# Patient Record
Sex: Female | Born: 1980 | Race: Black or African American | Hispanic: No | Marital: Single | Smoking: Current every day smoker
Health system: Southern US, Community
[De-identification: ages and names within clinical notes are randomized; demographics above are authoritative.]

## PROBLEM LIST (undated history)

## (undated) DIAGNOSIS — M419 Scoliosis, unspecified: Secondary | ICD-10-CM

## (undated) DIAGNOSIS — A599 Trichomoniasis, unspecified: Secondary | ICD-10-CM

## (undated) DIAGNOSIS — F32A Depression, unspecified: Secondary | ICD-10-CM

## (undated) DIAGNOSIS — F329 Major depressive disorder, single episode, unspecified: Secondary | ICD-10-CM

## (undated) HISTORY — DX: Major depressive disorder, single episode, unspecified: F32.9

## (undated) HISTORY — DX: Trichomoniasis, unspecified: A59.9

## (undated) HISTORY — DX: Depression, unspecified: F32.A

## (undated) HISTORY — PX: BUNIONECTOMY: SHX129

## (undated) HISTORY — PX: TUBAL LIGATION: SHX77

---
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
## 2001-02-21 ENCOUNTER — Emergency Department (HOSPITAL_COMMUNITY): Admission: EM | Admit: 2001-02-21 | Discharge: 2001-02-21 | Payer: Self-pay | Admitting: Emergency Medicine

## 2001-04-05 ENCOUNTER — Emergency Department (HOSPITAL_COMMUNITY): Admission: EM | Admit: 2001-04-05 | Discharge: 2001-04-06 | Payer: Self-pay | Admitting: Emergency Medicine

## 2002-03-22 ENCOUNTER — Emergency Department (HOSPITAL_COMMUNITY): Admission: EM | Admit: 2002-03-22 | Discharge: 2002-03-22 | Payer: Self-pay | Admitting: Emergency Medicine

## 2002-03-22 ENCOUNTER — Encounter: Payer: Self-pay | Admitting: Emergency Medicine

## 2002-03-22 ENCOUNTER — Inpatient Hospital Stay (HOSPITAL_COMMUNITY): Admission: AD | Admit: 2002-03-22 | Discharge: 2002-03-22 | Payer: Self-pay | Admitting: Obstetrics and Gynecology

## 2002-09-11 ENCOUNTER — Emergency Department (HOSPITAL_COMMUNITY): Admission: EM | Admit: 2002-09-11 | Discharge: 2002-09-11 | Payer: Self-pay | Admitting: Emergency Medicine

## 2002-10-30 ENCOUNTER — Emergency Department (HOSPITAL_COMMUNITY): Admission: EM | Admit: 2002-10-30 | Discharge: 2002-10-30 | Payer: Self-pay | Admitting: Emergency Medicine

## 2003-02-17 ENCOUNTER — Other Ambulatory Visit: Admission: RE | Admit: 2003-02-17 | Discharge: 2003-02-17 | Payer: Self-pay | Admitting: Family Medicine

## 2004-01-09 ENCOUNTER — Inpatient Hospital Stay (HOSPITAL_COMMUNITY): Admission: AD | Admit: 2004-01-09 | Discharge: 2004-01-09 | Payer: Self-pay | Admitting: Family Medicine

## 2004-02-27 ENCOUNTER — Other Ambulatory Visit: Admission: RE | Admit: 2004-02-27 | Discharge: 2004-02-27 | Payer: Self-pay | Admitting: Obstetrics and Gynecology

## 2004-07-20 ENCOUNTER — Inpatient Hospital Stay (HOSPITAL_COMMUNITY): Admission: AD | Admit: 2004-07-20 | Discharge: 2004-07-23 | Payer: Self-pay | Admitting: Obstetrics and Gynecology

## 2004-07-22 ENCOUNTER — Encounter (INDEPENDENT_AMBULATORY_CARE_PROVIDER_SITE_OTHER): Payer: Self-pay | Admitting: Specialist

## 2004-11-30 ENCOUNTER — Ambulatory Visit: Payer: Self-pay | Admitting: Family Medicine

## 2004-12-04 ENCOUNTER — Ambulatory Visit: Payer: Self-pay | Admitting: Family Medicine

## 2005-02-26 ENCOUNTER — Ambulatory Visit: Payer: Self-pay | Admitting: Family Medicine

## 2005-06-05 ENCOUNTER — Ambulatory Visit: Payer: Self-pay | Admitting: Family Medicine

## 2005-06-06 ENCOUNTER — Ambulatory Visit: Payer: Self-pay | Admitting: Internal Medicine

## 2005-06-12 ENCOUNTER — Ambulatory Visit: Payer: Self-pay | Admitting: Internal Medicine

## 2005-07-08 ENCOUNTER — Ambulatory Visit: Payer: Self-pay | Admitting: Internal Medicine

## 2005-07-15 ENCOUNTER — Ambulatory Visit: Payer: Self-pay | Admitting: Family Medicine

## 2005-08-29 ENCOUNTER — Ambulatory Visit: Payer: Self-pay | Admitting: Family Medicine

## 2005-10-28 ENCOUNTER — Ambulatory Visit: Payer: Self-pay | Admitting: Family Medicine

## 2005-10-31 ENCOUNTER — Ambulatory Visit: Payer: Self-pay | Admitting: Family Medicine

## 2005-12-31 ENCOUNTER — Ambulatory Visit: Payer: Self-pay | Admitting: Family Medicine

## 2006-01-02 ENCOUNTER — Ambulatory Visit: Payer: Self-pay | Admitting: Family Medicine

## 2006-09-04 ENCOUNTER — Ambulatory Visit: Payer: Self-pay | Admitting: Family Medicine

## 2007-02-10 ENCOUNTER — Ambulatory Visit (HOSPITAL_COMMUNITY): Admission: RE | Admit: 2007-02-10 | Discharge: 2007-02-10 | Payer: Self-pay | Admitting: Obstetrics and Gynecology

## 2007-03-05 ENCOUNTER — Inpatient Hospital Stay (HOSPITAL_COMMUNITY): Admission: AD | Admit: 2007-03-05 | Discharge: 2007-03-05 | Payer: Self-pay | Admitting: Obstetrics and Gynecology

## 2007-04-19 ENCOUNTER — Inpatient Hospital Stay (HOSPITAL_COMMUNITY): Admission: AD | Admit: 2007-04-19 | Discharge: 2007-04-21 | Payer: Self-pay | Admitting: Obstetrics and Gynecology

## 2007-05-07 ENCOUNTER — Encounter (INDEPENDENT_AMBULATORY_CARE_PROVIDER_SITE_OTHER): Payer: Self-pay | Admitting: Family Medicine

## 2007-08-06 ENCOUNTER — Emergency Department (HOSPITAL_COMMUNITY): Admission: EM | Admit: 2007-08-06 | Discharge: 2007-08-06 | Payer: Self-pay | Admitting: Emergency Medicine

## 2007-08-15 ENCOUNTER — Emergency Department (HOSPITAL_COMMUNITY): Admission: EM | Admit: 2007-08-15 | Discharge: 2007-08-15 | Payer: Self-pay | Admitting: Family Medicine

## 2007-11-24 ENCOUNTER — Telehealth (INDEPENDENT_AMBULATORY_CARE_PROVIDER_SITE_OTHER): Payer: Self-pay | Admitting: *Deleted

## 2008-05-15 ENCOUNTER — Emergency Department (HOSPITAL_COMMUNITY): Admission: EM | Admit: 2008-05-15 | Discharge: 2008-05-16 | Payer: Self-pay | Admitting: Emergency Medicine

## 2009-01-07 ENCOUNTER — Emergency Department (HOSPITAL_COMMUNITY): Admission: EM | Admit: 2009-01-07 | Discharge: 2009-01-07 | Payer: Self-pay | Admitting: Emergency Medicine

## 2009-04-05 ENCOUNTER — Inpatient Hospital Stay (HOSPITAL_COMMUNITY): Admission: AD | Admit: 2009-04-05 | Discharge: 2009-04-05 | Payer: Self-pay | Admitting: Diagnostic Radiology

## 2009-04-26 ENCOUNTER — Inpatient Hospital Stay (HOSPITAL_COMMUNITY): Admission: AD | Admit: 2009-04-26 | Discharge: 2009-04-26 | Payer: Self-pay | Admitting: Obstetrics & Gynecology

## 2009-05-17 ENCOUNTER — Emergency Department (HOSPITAL_COMMUNITY): Admission: EM | Admit: 2009-05-17 | Discharge: 2009-05-18 | Payer: Self-pay | Admitting: Emergency Medicine

## 2009-07-09 ENCOUNTER — Ambulatory Visit: Payer: Self-pay | Admitting: Physician Assistant

## 2009-07-09 ENCOUNTER — Inpatient Hospital Stay (HOSPITAL_COMMUNITY): Admission: AD | Admit: 2009-07-09 | Discharge: 2009-07-09 | Payer: Self-pay | Admitting: Obstetrics & Gynecology

## 2009-08-14 ENCOUNTER — Ambulatory Visit (HOSPITAL_COMMUNITY): Admission: RE | Admit: 2009-08-14 | Discharge: 2009-08-14 | Payer: Self-pay | Admitting: Obstetrics

## 2009-10-04 ENCOUNTER — Inpatient Hospital Stay (HOSPITAL_COMMUNITY): Admission: AD | Admit: 2009-10-04 | Discharge: 2009-10-04 | Payer: Self-pay | Admitting: Obstetrics

## 2009-11-21 ENCOUNTER — Inpatient Hospital Stay (HOSPITAL_COMMUNITY): Admission: AD | Admit: 2009-11-21 | Discharge: 2009-11-23 | Payer: Self-pay | Admitting: Obstetrics

## 2010-08-26 ENCOUNTER — Encounter: Payer: Self-pay | Admitting: Obstetrics and Gynecology

## 2010-10-23 LAB — CBC
HCT: 26.7 % — ABNORMAL LOW (ref 36.0–46.0)
Hemoglobin: 8.9 g/dL — ABNORMAL LOW (ref 12.0–15.0)
MCHC: 33.3 g/dL (ref 30.0–36.0)
MCV: 84.1 fL (ref 78.0–100.0)
Platelets: 156 10*3/uL (ref 150–400)
RBC: 3.18 MIL/uL — ABNORMAL LOW (ref 3.87–5.11)
RDW: 15.5 % (ref 11.5–15.5)
WBC: 7.2 10*3/uL (ref 4.0–10.5)

## 2010-10-23 LAB — RPR: RPR Ser Ql: NONREACTIVE

## 2010-10-29 LAB — CBC
HCT: 27.6 % — ABNORMAL LOW (ref 36.0–46.0)
Hemoglobin: 9.2 g/dL — ABNORMAL LOW (ref 12.0–15.0)
MCHC: 33.2 g/dL (ref 30.0–36.0)
MCV: 86.4 fL (ref 78.0–100.0)
Platelets: 175 10*3/uL (ref 150–400)
RBC: 3.19 MIL/uL — ABNORMAL LOW (ref 3.87–5.11)
RDW: 13.5 % (ref 11.5–15.5)
WBC: 5.9 10*3/uL (ref 4.0–10.5)

## 2010-11-06 LAB — GC/CHLAMYDIA PROBE AMP, GENITAL
Chlamydia, DNA Probe: NEGATIVE
GC Probe Amp, Genital: POSITIVE — AB

## 2010-11-06 LAB — URINALYSIS, ROUTINE W REFLEX MICROSCOPIC
Bilirubin Urine: NEGATIVE
Glucose, UA: NEGATIVE mg/dL
Ketones, ur: NEGATIVE mg/dL
Nitrite: NEGATIVE
Protein, ur: NEGATIVE mg/dL
Specific Gravity, Urine: 1.025 (ref 1.005–1.030)
Urobilinogen, UA: 0.2 mg/dL (ref 0.0–1.0)
pH: 6 (ref 5.0–8.0)

## 2010-11-06 LAB — URINE MICROSCOPIC-ADD ON

## 2010-11-06 LAB — WET PREP, GENITAL
Trich, Wet Prep: NONE SEEN
Yeast Wet Prep HPF POC: NONE SEEN

## 2010-11-08 LAB — BASIC METABOLIC PANEL
BUN: 9 mg/dL (ref 6–23)
CO2: 23 mEq/L (ref 19–32)
Calcium: 8.3 mg/dL — ABNORMAL LOW (ref 8.4–10.5)
Chloride: 107 mEq/L (ref 96–112)
Creatinine, Ser: 0.5 mg/dL (ref 0.4–1.2)
GFR calc Af Amer: >60 mL/min (ref >60)
GFR calc non Af Amer: >60 mL/min (ref >60)
Glucose, Bld: 82 mg/dL (ref 70–99)
Potassium: 3.5 mEq/L (ref 3.5–5.1)
Sodium: 138 mEq/L (ref 135–145)

## 2010-11-08 LAB — URINALYSIS, ROUTINE W REFLEX MICROSCOPIC
Bilirubin Urine: NEGATIVE
Glucose, UA: NEGATIVE mg/dL
Hgb urine dipstick: NEGATIVE
Ketones, ur: 40 mg/dL — AB
Nitrite: NEGATIVE
Protein, ur: NEGATIVE mg/dL
Specific Gravity, Urine: 1.027 (ref 1.005–1.030)
Urobilinogen, UA: 0.2 mg/dL (ref 0.0–1.0)
pH: 7 (ref 5.0–8.0)

## 2010-11-08 LAB — URINE MICROSCOPIC-ADD ON

## 2010-11-09 LAB — URINE CULTURE

## 2010-11-09 LAB — DIFFERENTIAL
Basophils Absolute: 0 10*3/uL (ref 0.0–0.1)
Basophils Relative: 0 % (ref 0–1)
Eosinophils Absolute: 0 10*3/uL (ref 0.0–0.7)
Eosinophils Relative: 0 % (ref 0–5)
Lymphocytes Relative: 18 % (ref 12–46)
Lymphs Abs: 1.2 10*3/uL (ref 0.7–4.0)
Monocytes Absolute: 0.4 10*3/uL (ref 0.1–1.0)
Monocytes Relative: 7 % (ref 3–12)
Neutro Abs: 4.8 10*3/uL (ref 1.7–7.7)
Neutrophils Relative %: 74 % (ref 43–77)

## 2010-11-09 LAB — WET PREP, GENITAL
Trich, Wet Prep: NONE SEEN
Yeast Wet Prep HPF POC: NONE SEEN

## 2010-11-09 LAB — URINALYSIS, ROUTINE W REFLEX MICROSCOPIC
Bilirubin Urine: NEGATIVE
Bilirubin Urine: NEGATIVE
Bilirubin Urine: NEGATIVE
Glucose, UA: NEGATIVE mg/dL
Glucose, UA: NEGATIVE mg/dL
Hgb urine dipstick: NEGATIVE
Ketones, ur: NEGATIVE mg/dL
Ketones, ur: >80 mg/dL — AB
Nitrite: NEGATIVE
Protein, ur: NEGATIVE mg/dL
Protein, ur: NEGATIVE mg/dL
Specific Gravity, Urine: 1.015 (ref 1.005–1.030)
Urobilinogen, UA: 0.2 mg/dL (ref 0.0–1.0)
Urobilinogen, UA: 0.2 mg/dL (ref 0.0–1.0)
pH: 8.5 — ABNORMAL HIGH (ref 5.0–8.0)
pH: 7 (ref 5.0–8.0)

## 2010-11-09 LAB — COMPREHENSIVE METABOLIC PANEL
ALT: 11 U/L (ref 0–35)
ALT: 9 U/L (ref 0–35)
AST: 18 U/L (ref 0–37)
AST: 14 U/L (ref 0–37)
Albumin: 3.5 g/dL (ref 3.5–5.2)
Alkaline Phosphatase: 54 U/L (ref 39–117)
BUN: 6 mg/dL (ref 6–23)
CO2: 24 mEq/L (ref 19–32)
CO2: 21 mEq/L (ref 19–32)
Calcium: 9.3 mg/dL (ref 8.4–10.5)
Calcium: 9 mg/dL (ref 8.4–10.5)
Chloride: 105 mEq/L (ref 96–112)
Chloride: 106 mEq/L (ref 96–112)
Creatinine, Ser: 0.57 mg/dL (ref 0.4–1.2)
GFR calc Af Amer: >60 mL/min (ref >60)
GFR calc Af Amer: >60 mL/min (ref >60)
GFR calc non Af Amer: >60 mL/min (ref >60)
GFR calc non Af Amer: >60 mL/min (ref >60)
Glucose, Bld: 97 mg/dL (ref 70–99)
Potassium: 3.8 mEq/L (ref 3.5–5.1)
Sodium: 134 mEq/L — ABNORMAL LOW (ref 135–145)
Sodium: 135 mEq/L (ref 135–145)
Total Bilirubin: 0.4 mg/dL (ref 0.3–1.2)
Total Protein: 6.4 g/dL (ref 6.0–8.3)

## 2010-11-09 LAB — URINE MICROSCOPIC-ADD ON

## 2010-11-09 LAB — CBC
HCT: 35.5 % — ABNORMAL LOW (ref 36.0–46.0)
Hemoglobin: 11.8 g/dL — ABNORMAL LOW (ref 12.0–15.0)
MCHC: 33.3 g/dL (ref 30.0–36.0)
MCHC: 33.4 g/dL (ref 30.0–36.0)
MCV: 90.2 fL (ref 78.0–100.0)
Platelets: 205 10*3/uL (ref 150–400)
RBC: 3.93 MIL/uL (ref 3.87–5.11)
RBC: 3.77 MIL/uL — ABNORMAL LOW (ref 3.87–5.11)
RDW: 13.1 % (ref 11.5–15.5)
WBC: 6.4 10*3/uL (ref 4.0–10.5)
WBC: 6.9 10*3/uL (ref 4.0–10.5)

## 2010-11-09 LAB — GC/CHLAMYDIA PROBE AMP, GENITAL: Chlamydia, DNA Probe: NEGATIVE

## 2010-12-17 ENCOUNTER — Other Ambulatory Visit: Payer: Self-pay | Admitting: Obstetrics

## 2010-12-17 ENCOUNTER — Inpatient Hospital Stay (HOSPITAL_COMMUNITY)
Admission: AD | Admit: 2010-12-17 | Discharge: 2010-12-19 | DRG: 767 | Disposition: A | Discharge status: Home / Self Care | Payer: Medicaid Other | Source: Ambulatory Visit | Attending: Obstetrics | Admitting: Obstetrics

## 2010-12-17 DIAGNOSIS — O99892 Other specified diseases and conditions complicating childbirth: Secondary | ICD-10-CM | POA: Diagnosis present

## 2010-12-17 DIAGNOSIS — Z22330 Carrier of Group B streptococcus: Secondary | ICD-10-CM

## 2010-12-17 DIAGNOSIS — O4290 Premature rupture of membranes, unspecified as to length of time between rupture and onset of labor, unspecified weeks of gestation: Secondary | ICD-10-CM | POA: Diagnosis present

## 2010-12-17 DIAGNOSIS — Z302 Encounter for sterilization: Secondary | ICD-10-CM

## 2010-12-17 LAB — CBC
Hemoglobin: 11.6 g/dL — ABNORMAL LOW (ref 12.0–15.0)
MCHC: 32.7 g/dL (ref 30.0–36.0)
Platelets: 156 10*3/uL (ref 150–400)
RDW: 14.1 % (ref 11.5–15.5)

## 2010-12-17 LAB — SURGICAL PCR SCREEN: Staphylococcus aureus: NEGATIVE

## 2010-12-17 LAB — RPR: RPR Ser Ql: NONREACTIVE

## 2010-12-18 LAB — CBC
MCH: 26.7 pg (ref 26.0–34.0)
MCHC: 31.5 g/dL (ref 30.0–36.0)
Platelets: 150 10*3/uL (ref 150–400)
RDW: 14.3 % (ref 11.5–15.5)

## 2010-12-19 NOTE — Op Note (Signed)
  NAMESYDELLE, SHERFIELD               ACCOUNT NO.:  [id number]  MEDICAL RECORD NO.:  [id number]           PATIENT TYPE:  I  LOCATION:  9104                          FACILITY:  [facility]  PHYSICIAN:  Kathreen Cosier, M.D.DATE OF BIRTH:  [birth year]  DATE OF PROCEDURE:  12/18/2010 DATE OF DISCHARGE:                              OPERATIVE REPORT   PREOPERATIVE DIAGNOSIS:  Multiparity, desires sterilization postpartum.  POSTOPERATIVE DIAGNOSIS:  Multiparity, desires sterilization postpartum.  SURGEON:  Kathreen Cosier, MD  ANESTHESIA:  Spinal.  PROCEDURE IN DETAIL:  The patient was placed on the operating table in supine position after the spinal administered.  Abdomen prepped and draped.  Bladder emptied with Foley catheter.  Midline subumbilical incision 1-inch long made and carried down to the fascia.  Fascia cleaned, grasped with two Kochers and the fascia and the peritoneum opened with the Mayo scissors.  Left tube grasped in midportion with a Babcock clamp.  Zero plain suture placed in the mesosalpinx below the portion of tube within the clamp.  This was tied and approximately 1 inch of tube was transected.  Hemostasis was satisfactory.  The procedure was done in a similar fashion on the other side.  Lap and sponge counts correct.  Abdomen closed in layers.  Peritoneum and fascia with continuous suture of Dexon and the skin closed with subcuticular stitch of 4-0 Monocryl.  Blood loss was minimal.  The patient tolerated the procedure well and taken to the recovery room in good condition.          ______________________________ Kathreen Cosier, M.D.     BAM/MEDQ  D:  12/18/2010  T:  12/18/2010  Job:  [id number]  Electronically Signed by Francoise Ceo M.D. on 12/19/2010 09:46:23 AM

## 2010-12-19 NOTE — H&P (Signed)
  NAMEALYSSAH, ALGEO               ACCOUNT NO.:  [id number]  MEDICAL RECORD NO.:  [id number]           PATIENT TYPE:  I  LOCATION:  9104                          [facility]:  [facility]  PHYSICIAN:  Kathreen Cosier, M.D.DATE OF BIRTH:  Aug 27, 1980  DATE OF ADMISSION:  12/17/2010 DATE OF DISCHARGE:                             HISTORY & PHYSICAL   CHIEF COMPLAINT:  Desires sterilization.  HISTORY OF PRESENT ILLNESS:  The patient is a 30 year old gravida 6, para 4-1-0-5, [facility] Dec 23, 2010 who was admitted in labor on Dec 17, 2010 and had a normal vaginal delivery and desired sterilization.  PAST MEDICAL HISTORY:  She has had 5 full-term babies and 1 premature baby.  FAMILY HISTORY:  Noncontributory.  SOCIAL HISTORY:  She denied smoking, drinking, or any kind of drug use.  ALLERGIES:  None.  PHYSICAL EXAMINATION:  GENERAL:  A well-developed female in no distress. HEENT:  Negative. BREASTS:  No masses. HEART:  Regular rhythm.  No murmurs, no gallops, and no rubs. LUNGS:  Clear to percussion and auscultation. ABDOMEN:  20-week postpartum size uterus. PELVIC:  Deferred. EXTREMITIES:  Negative.          ______________________________ Kathreen Cosier, M.D.     BAM/MEDQ  D:  12/18/2010  T:  12/18/2010  Job:  [id number]  Electronically Signed by Francoise Ceo M.D. on 12/19/2010 09:46:20 AM

## 2010-12-21 NOTE — H&P (Signed)
NAMEALLIEN, MELBERG               ACCOUNT NO.:  [id number]   MEDICAL RECORD NO.:  [id number]           PATIENT TYPE:   LOCATION:                                 FACILITY:   PHYSICIAN:  Hal Morales, M.D.     DATE OF BIRTH:   DATE OF ADMISSION:  07/20/2004  DATE OF DISCHARGE:                                HISTORY & PHYSICAL   Tammy May is a 30 year old gravida 4, para 2-0-1-2 at 36-1/7 weeks who  presented complaining of onset of bright red bleeding approximately 8:15  p.m.  She denies any significant cramping, drug use, trauma, recent  intercourse, or pain.  She has no dysuria.  She reports positive fetal  movement.  While in maternity admissions unit, the patient was monitored.  She was found to be having some contractions, although the cervix was only 1  cm.  The baby was reactive.  Ultrasound was normal.  Labs were normal;  however, in light of the continued small to moderate amount of bright red  bleeding, the patient was admitted overnight for continued observation.   Pregnancy has been remarkable for:  1.  History of rapid labor.  2.  History of chlamydia in the past.  3.  History of chlamydia in June of 2005 with negative test of cure.  4.  Diminished decreased BMI.  5.  History of postpartum depression.  6.  Family history of polydactyly.  7.  Smoker before pregnancy but none since positive UPT.   PRENATAL LABORATORY DATA:  Blood type is O positive, Rh antibody negative.  VDRL nonreactive.  Rubella titer positive, hepatitis B surface antigen  negative.  HIV nonreactive.  GC and chlamydia cultures were done in June.  Gonorrhea was negative.  Chlamydia was positive.  Follow-up test of cure was  negative.  Group B strep culture was negative at 36 weeks.  GC and chlamydia  cultures were negative at 36 weeks.  Cystic fibrosis testing was negative.  Hemoglobin upon entering the practice was 11.4.  It was 11.4 at 28 weeks.  EDC of August 16, 2004 was established by  last menstrual period and was in  agreement with ultrasound at approximately 18 weeks.   HISTORY OF PRESENT PREGNANCY:  The patient entered care at approximately 10  weeks.  She had some first trimester bleeding and had an ultrasound.  She  was having some increased stress and decreased appetite at 15 weeks.  Referral was offered for counseling.  The patient declined at present.  Quadruple screen was normal.  Test of cure from her first visit with  positive chlamydia was negative.  She had an ultrasound at 18 weeks that  showed normal growth and development.  Placenta was posterior.  Fluid was  normal.  She had another ultrasound at 28 weeks for low weight gain and size  less than dates.  Growth was at the 25-50th percentile.  Placenta was  posterior.  Cervix was normal.  Glucola was normal.  She had another  ultrasound at 35 weeks that showed growth at the 25th to 50th percentile.  Cervix was  3.2.  Fluid was normal.  The rest of her pregnancy has been  uncomplicated.   OBSTETRICAL HISTORY:  In 1998, she had spontaneous miscarriage with no  complications.  In 1999, she had a vaginal birth of a female infant, weight 5  pounds, 14 ounces at 41 weeks.  She was in labor 16 hours.  She had epidural  anesthesia.  She was induced, had no complications.  In 2001, she had a  vaginal birth of a female infant, weight 7 pounds at 40 weeks.  She was in  labor approximately 1 hour.  She was followed at high-risk clinic but was  not sure why.  This pregnancy is with a new partner.  She had diminished  iron with both pregnancies.  She had postpartum depression with both  pregnancies, worse after her third pregnancy.  It resolved spontaneously  after 2 weeks.   MEDICAL HISTORY:  She is a previous condom user.  She was treated for  chlamydia in 2002 and then in June, 2005.  She reports usual childhood  illnesses.   ALLERGIES:  None.   FAMILY HISTORY:  The patient's maternal grandfather had rheumatoid   arthritis.  Mother is an alcohol user.   GENETIC HISTORY:  Remarkable for the patient's son having extra digits on  both hands.  His father also had extra digits.  The patient is not sure  about this father of the baby's family history.   SOCIAL HISTORY:  The patient is single.  Father of the baby is involved in  support.  His name is Roman Designer, multimedia.  The patient has a 10th grade  education.  She is a home health aide.  Her partner has an 11th grade  education.  He is a Consulting civil engineer and a Medical sales representative.  She has been followed  by the certified nurse midwife service at [facility].  She denies  any alcohol, drug, or tobacco use during this pregnancy.   PHYSICAL EXAMINATION:  VITAL SIGNS:  Stable.  The patient is afebrile.  Orthostatic blood pressures and pulses are stable.  HEENT:  Within normal limits.  LUNGS:  Breath sounds are clear.  HEART:  Regular rate and rhythm without murmur.  BREASTS:  Soft and nontender.  FETAL MONITORING:  Shows reactive fetal heart rate tracing.  On the initial  tracing there was some questionable missed beats that were audible at times  but no decelerations were noted.  Uterine contractions: There were none  initially.  Then after prolonged observation, she was contracting  approximately every 6 minutes but very mild.  ABDOMEN:  Soft and nontender and gravid.  PELVIC:  Speculum exam showed initially a moderate amount of dark blood in  the vault.  Once this was removed, via Fox swab, there was an overall small  amount of active bleeding.  This was more obvious when the patient got up to  move around.  The cervix was posterior, 1 cm, 50%, vertex at a -2 station.  Vertex was verified by bedside ultrasound.  EXTREMITIES:  Deep tendon reflexes are 2+ without clonus.  There is some  trace edema noted.   Ultrasound showed anterior placenta, no previa, normal fluid.  BPP of 8/8.  CBC showed hemoglobin of 11.0, platelets were 183.   ASSESSMENT:  1.   Intrauterine pregnancy at 36-1/7 weeks.  2.  Third trimester bleeding with questionable etiology.  3.  Evidence of fetal well being.   PLAN:  1.  Admit to [facility] for consult with Dr. Dierdre Forth as attending physician for overnight observation.  2.  Dr. Pennie Rushing discussed possible need for delivery with patient but will      defer for now in light of reassuring fetal heart rate status.  3.  We will continuously monitor and follow up in the morning with      reevaluation.     Vick   VLL/MEDQ  D:  07/20/2004  T:  07/20/2004  Job:  [id number]

## 2011-05-06 LAB — URINALYSIS, ROUTINE W REFLEX MICROSCOPIC
Ketones, ur: >80 — AB
Nitrite: NEGATIVE
Protein, ur: NEGATIVE

## 2011-05-06 LAB — POCT I-STAT, CHEM 8
Calcium, Ion: 1.14
Creatinine, Ser: 0.8
Hemoglobin: 14.6
Sodium: 141
TCO2: 19

## 2011-05-06 LAB — URINE CULTURE: Colony Count: 15000

## 2011-05-06 LAB — POCT PREGNANCY, URINE: Preg Test, Ur: NEGATIVE

## 2011-05-16 LAB — CBC
MCHC: 33.8
Platelets: 198
RBC: 3.76 — ABNORMAL LOW
WBC: 12.2 — ABNORMAL HIGH

## 2011-05-17 LAB — CBC
MCV: 86.4
Platelets: 201
RBC: 3.93
WBC: 6.6

## 2011-05-17 LAB — RPR: RPR Ser Ql: NONREACTIVE

## 2011-05-20 LAB — CBC
Hemoglobin: 10 — ABNORMAL LOW
MCHC: 33.5
RBC: 3.42 — ABNORMAL LOW

## 2011-05-20 LAB — COMPREHENSIVE METABOLIC PANEL
ALT: 12
Alkaline Phosphatase: 134 — ABNORMAL HIGH
CO2: 19
GFR calc non Af Amer: >60
Glucose, Bld: 172 — ABNORMAL HIGH
Potassium: 3.2 — ABNORMAL LOW
Sodium: 132 — ABNORMAL LOW
Total Bilirubin: 0.8

## 2011-05-20 LAB — T3: T3, Total: 210.5 — ABNORMAL HIGH

## 2012-07-28 ENCOUNTER — Emergency Department (INDEPENDENT_AMBULATORY_CARE_PROVIDER_SITE_OTHER)
Admission: EM | Admit: 2012-07-28 | Discharge: 2012-07-28 | Disposition: A | Discharge status: Home / Self Care | Payer: Medicaid Other | Source: Home / Self Care

## 2012-07-28 ENCOUNTER — Encounter (HOSPITAL_COMMUNITY): Payer: Self-pay | Admitting: Emergency Medicine

## 2012-07-28 DIAGNOSIS — A088 Other specified intestinal infections: Secondary | ICD-10-CM

## 2012-07-28 DIAGNOSIS — A084 Viral intestinal infection, unspecified: Secondary | ICD-10-CM

## 2012-07-28 MED ORDER — ONDANSETRON 4 MG PO TBDP
ORAL_TABLET | ORAL | Status: AC
  Filled 2012-07-28: qty 2

## 2012-07-28 MED ORDER — ONDANSETRON HCL 4 MG PO TABS: 4.0000 mg | ORAL_TABLET | Freq: Four times a day (QID) | ORAL | Status: DC

## 2012-07-28 MED ORDER — ONDANSETRON 4 MG PO TBDP
8.0000 mg | ORAL_TABLET | Freq: Once | ORAL | Status: AC
  Administered 2012-07-28: 8 mg via ORAL

## 2012-07-28 NOTE — ED Provider Notes (Signed)
History     CSN: [id number]  Arrival date & time 07/28/12  1109   None     Chief Complaint  Patient presents with  . Emesis    (Consider location/radiation/quality/duration/timing/severity/associated sxs/prior treatment) Patient is a 31 y.o. female presenting with vomiting. The history is provided by the patient.  Emesis  This is a new problem. The current episode started 2 days ago. The problem occurs 2 to 4 times per day. The problem has not changed since onset.The emesis has an appearance of stomach contents. There has been no fever. Associated symptoms include abdominal pain, chills, diarrhea and a fever. Pertinent negatives include no cough and no URI.    History reviewed. No pertinent past medical history.  No past surgical history on file.  No family history on file.  History  Substance Use Topics  . Smoking status: Not on file  . Smokeless tobacco: Not on file  . Alcohol Use: Not on file    OB History    Grav Para Term Preterm Abortions TAB SAB Ect Mult Living                  Review of Systems  Constitutional: Positive for fever and chills.  Respiratory: Negative for cough.   Gastrointestinal: Positive for vomiting, abdominal pain and diarrhea.    Allergies  Review of patient's allergies indicates no known allergies.  Home Medications   Current Outpatient Rx  Name  Route  Sig  Dispense  Refill  . ONDANSETRON HCL 4 MG PO TABS   Oral   Take 1 tablet (4 mg total) by mouth every 6 (six) hours. For n/v   6 tablet   0     BP 92/58  Pulse 70  Temp 98.3 F (36.8 C) (Oral)  Resp 18  SpO2 98%  Physical Exam  Nursing note and vitals reviewed. Constitutional: She is oriented to person, place, and time. She appears well-developed and well-nourished.  HENT:  Mouth/Throat: Oropharynx is clear and moist and mucous membranes are normal.  Neck: Normal range of motion. Neck supple.  Pulmonary/Chest: Effort normal and breath sounds normal.  Abdominal:  Soft. Bowel sounds are normal. She exhibits no distension and no mass. There is tenderness. There is no rebound and no guarding.  Neurological: She is alert and oriented to person, place, and time.  Skin: Skin is warm and dry.    ED Course  Procedures (including critical care time)  Labs Reviewed - No data to display No results found.   1. Gastroenteritis and colitis, viral       MDM          Linna Hoff, MD 07/28/12 [phone]

## 2012-07-28 NOTE — ED Notes (Signed)
Reports vomiting for two days. C/o chills cramping, abd pain and diarrhea.  Patient has used OTC medications but no relief.

## 2012-08-27 ENCOUNTER — Emergency Department (HOSPITAL_COMMUNITY)
Admission: EM | Admit: 2012-08-27 | Discharge: 2012-08-27 | Disposition: A | Discharge status: Home / Self Care | Payer: Self-pay | Attending: Emergency Medicine | Admitting: Emergency Medicine

## 2012-08-27 ENCOUNTER — Encounter (HOSPITAL_COMMUNITY): Payer: Self-pay | Admitting: *Deleted

## 2012-08-27 ENCOUNTER — Emergency Department (HOSPITAL_COMMUNITY): Payer: Self-pay

## 2012-08-27 DIAGNOSIS — S2341XA Sprain of ribs, initial encounter: Secondary | ICD-10-CM | POA: Insufficient documentation

## 2012-08-27 DIAGNOSIS — R079 Chest pain, unspecified: Secondary | ICD-10-CM

## 2012-08-27 DIAGNOSIS — Z79899 Other long term (current) drug therapy: Secondary | ICD-10-CM | POA: Insufficient documentation

## 2012-08-27 DIAGNOSIS — R0789 Other chest pain: Secondary | ICD-10-CM | POA: Insufficient documentation

## 2012-08-27 DIAGNOSIS — F17200 Nicotine dependence, unspecified, uncomplicated: Secondary | ICD-10-CM | POA: Insufficient documentation

## 2012-08-27 DIAGNOSIS — R0602 Shortness of breath: Secondary | ICD-10-CM | POA: Insufficient documentation

## 2012-08-27 DIAGNOSIS — S29011A Strain of muscle and tendon of front wall of thorax, initial encounter: Secondary | ICD-10-CM

## 2012-08-27 DIAGNOSIS — X58XXXA Exposure to other specified factors, initial encounter: Secondary | ICD-10-CM | POA: Insufficient documentation

## 2012-08-27 DIAGNOSIS — Y929 Unspecified place or not applicable: Secondary | ICD-10-CM | POA: Insufficient documentation

## 2012-08-27 DIAGNOSIS — Y939 Activity, unspecified: Secondary | ICD-10-CM | POA: Insufficient documentation

## 2012-08-27 LAB — CBC WITH DIFFERENTIAL/PLATELET
Basophils Absolute: 0 10*3/uL (ref 0.0–0.1)
Eosinophils Absolute: 0.1 10*3/uL (ref 0.0–0.7)
Eosinophils Relative: 1 % (ref 0–5)
Lymphs Abs: 2.6 10*3/uL (ref 0.7–4.0)
MCH: 28.8 pg (ref 26.0–34.0)
MCV: 87.1 fL (ref 78.0–100.0)
Platelets: 254 10*3/uL (ref 150–400)
RDW: 13.1 % (ref 11.5–15.5)

## 2012-08-27 LAB — COMPREHENSIVE METABOLIC PANEL
ALT: 12 U/L (ref 0–35)
Calcium: 8.8 mg/dL (ref 8.4–10.5)
GFR calc Af Amer: >90 mL/min (ref >90)
Glucose, Bld: 87 mg/dL (ref 70–99)
Sodium: 139 mEq/L (ref 135–145)
Total Protein: 7.2 g/dL (ref 6.0–8.3)

## 2012-08-27 MED ORDER — HYDROCODONE-ACETAMINOPHEN 5-325 MG PO TABS
2.0000 | ORAL_TABLET | Freq: Once | ORAL | Status: AC
  Administered 2012-08-27: 2 via ORAL
  Filled 2012-08-27: qty 2

## 2012-08-27 MED ORDER — IBUPROFEN 600 MG PO TABS: 600.0000 mg | ORAL_TABLET | Freq: Four times a day (QID) | ORAL | Status: DC | PRN

## 2012-08-27 MED ORDER — HYDROCODONE-ACETAMINOPHEN 5-325 MG PO TABS: 1.0000 | ORAL_TABLET | Freq: Four times a day (QID) | ORAL | Status: DC | PRN

## 2012-08-27 MED ORDER — CYCLOBENZAPRINE HCL 10 MG PO TABS: 10.0000 mg | ORAL_TABLET | Freq: Two times a day (BID) | ORAL | Status: DC | PRN

## 2012-08-27 NOTE — ED Provider Notes (Signed)
History   This chart was scribed for non-physician practitioner working with Richardean Canal, MD by Bennett Scrape, ED Scribe. This patient was seen in room TR04C/TR04C and the patient's care was started at 21:05.  CSN: [id number]  Arrival date & time 08/27/12  1831   First MD Initiated Contact with Patient 08/27/12 2037      Chief Complaint  Patient presents with  . Cough  . Shortness of Breath    Patient is a 32 y.o. female presenting with cough and shortness of breath.  Cough Associated symptoms include chest pain. Pertinent negatives include no shortness of breath and no wheezing.  Shortness of Breath  Associated symptoms include chest pain and cough. Pertinent negatives include no fever, no shortness of breath and no wheezing.    Tammy May is a 32 y.o. female who presents to the Emergency Department complaining of 3 days of new, constant, unchanged, moderate chest pain with productive cough. Pain is aggravated with cough, pressure, and certain positions, while alleviated by nothing. No injury mechanism denoted. There has been no improvement despite taking Percocet and Muscle Relaxer. Pt also c/o 3 weeks of constant cough with green sputum. Pt is a current everyday smoker, admitting occassional alcohol use. No pertinent medical or surgical Hx denoted.   History reviewed. No pertinent past medical history.  Past Surgical History  Procedure Date  . Tubal ligation     No family history on file.  History  Substance Use Topics  . Smoking status: Current Every Day Smoker -- 0.5 packs/day    Types: Cigarettes  . Smokeless tobacco: Not on file  . Alcohol Use: Yes     Comment: occasionally    Review of Systems  Constitutional: Negative for fever.  Respiratory: Positive for cough and chest tightness. Negative for shortness of breath and wheezing.   Cardiovascular: Positive for chest pain.  Gastrointestinal: Negative for nausea, vomiting and diarrhea.  All other systems  reviewed and are negative.    Allergies  Review of patient's allergies indicates no known allergies.  Home Medications   Current Outpatient Rx  Name  Route  Sig  Dispense  Refill  . FLUOXETINE HCL 20 MG PO CAPS   Oral   Take 20 mg by mouth daily.         Marland Kitchen MIRTAZAPINE 15 MG PO TABS   Oral   Take 15 mg by mouth at bedtime.           BP 105/55  Pulse 87  Temp 98.4 F (36.9 C) (Oral)  Resp 18  SpO2 100%  LMP 08/05/2012  Physical Exam  Nursing note and vitals reviewed. Constitutional: She is oriented to person, place, and time. She appears well-developed and well-nourished. No distress.  HENT:  Head: Normocephalic and atraumatic.  Eyes: Conjunctivae normal and EOM are normal.  Neck: Neck supple. No tracheal deviation present.  Cardiovascular: Normal rate.   Pulmonary/Chest: Effort normal. No respiratory distress.       Tenderness to the intercostal muscles.  Abdominal: She exhibits no distension.  Musculoskeletal: Normal range of motion.  Neurological: She is alert and oriented to person, place, and time. No sensory deficit.  Skin: Skin is dry.  Psychiatric: She has a normal mood and affect. Her behavior is normal.    ED Course  Procedures DIAGNOSTIC STUDIES: Oxygen Saturation is 100% on room air, normal by my interpretation.    COORDINATION OF CARE: 19:13- Ordered DG Chest 2 View 1 time imaging. 19:14- Ordered CBC  with Differentials and Comprehensive metabolic panel STAT 21:05- Evaluated Pt. Pt is awake, alert, and without distress. 21:10- Patient understand and agree with initial ED impression and plan with expectations set for ED visit. Discussed use of cold compress for treatmeent.    Labs Reviewed  CBC WITH DIFFERENTIAL  COMPREHENSIVE METABOLIC PANEL   Dg Chest 2 View  08/27/2012  *RADIOLOGY REPORT*  Clinical Data: Chest pain and shortness of breath.  CHEST - 2 VIEW  Comparison: PA and lateral chest 03/05/2007.  Findings: Lungs are clear.  No  pneumothorax or pleural effusion. Heart size normal.  IMPRESSION: Negative chest.   Original Report Authenticated By: Holley Dexter, M.D.      1. Chest pain   2. Intercostal muscle strain       MDM  32 year old female with cough and chest pain. No evidence of pneumonia, doubt cardiac etiology, as the pain is reproducible with movement and with coughing. Suspect that this likely intercostal muscle strain. Will treat with pain medicine and muscle relaxer and ice. Patient understands and agrees with the plan. She is stable and ready for discharge.      I personally performed the services described in this documentation, which was scribed in my presence. The recorded information has been reviewed and is accurate.     Roxy Horseman, PA-C 08/28/12 [phone]

## 2012-08-27 NOTE — ED Notes (Signed)
Pt c/o cough that is productive with yellow/green sputum.  States whenever she coughs, sneezes or cries her chest hurts.  Afebrile with no audible wheezing.  Pt with sats of 100% on RA.

## 2012-08-29 NOTE — ED Provider Notes (Signed)
Medical screening examination/treatment/procedure(s) were performed by non-physician practitioner and as supervising physician I was immediately available for consultation/collaboration.   David H Yao, MD 08/29/12 0902 

## 2013-12-21 ENCOUNTER — Emergency Department (HOSPITAL_COMMUNITY)
Admission: EM | Admit: 2013-12-21 | Discharge: 2013-12-21 | Disposition: A | Discharge status: Home / Self Care | Payer: Medicaid Other | Attending: Emergency Medicine | Admitting: Emergency Medicine

## 2013-12-21 ENCOUNTER — Emergency Department (HOSPITAL_COMMUNITY): Payer: Medicaid Other

## 2013-12-21 ENCOUNTER — Encounter (HOSPITAL_COMMUNITY): Payer: Self-pay | Admitting: Emergency Medicine

## 2013-12-21 DIAGNOSIS — R1110 Vomiting, unspecified: Secondary | ICD-10-CM | POA: Insufficient documentation

## 2013-12-21 DIAGNOSIS — F17200 Nicotine dependence, unspecified, uncomplicated: Secondary | ICD-10-CM | POA: Insufficient documentation

## 2013-12-21 DIAGNOSIS — J189 Pneumonia, unspecified organism: Secondary | ICD-10-CM

## 2013-12-21 DIAGNOSIS — Z79899 Other long term (current) drug therapy: Secondary | ICD-10-CM | POA: Insufficient documentation

## 2013-12-21 DIAGNOSIS — J159 Unspecified bacterial pneumonia: Secondary | ICD-10-CM | POA: Insufficient documentation

## 2013-12-21 HISTORY — DX: Scoliosis, unspecified: M41.9

## 2013-12-21 MED ORDER — HYDROCODONE-HOMATROPINE 5-1.5 MG/5ML PO SYRP: 5.0000 mL | ORAL_SOLUTION | Freq: Four times a day (QID) | ORAL | Status: DC | PRN

## 2013-12-21 MED ORDER — LEVOFLOXACIN 500 MG PO TABS: 500.0000 mg | ORAL_TABLET | Freq: Every day | ORAL | Status: DC

## 2013-12-21 MED ORDER — ALBUTEROL SULFATE HFA 108 (90 BASE) MCG/ACT IN AERS
2.0000 | INHALATION_SPRAY | Freq: Once | RESPIRATORY_TRACT | Status: AC
  Administered 2013-12-21: 2 via RESPIRATORY_TRACT
  Filled 2013-12-21: qty 6.7

## 2013-12-21 NOTE — ED Provider Notes (Signed)
CSN: [id number]     Arrival date & time 12/21/13  1004 History  This chart was scribed for non-physician practitioner, [name], PA-C working with Raeford RazorStephen Kohut, MD by Greggory StallionKayla Andersen, ED scribe. This patient was seen in room TR06C/TR06C and the patient's care was started at 11:18 AM.   Chief Complaint  Patient presents with  . Cough   The history is provided by the patient. No language interpreter was used.   HPI Comments: Tammy May is a 33 y.o. female who presents to the Emergency Department complaining of cough that started 3-4 days ago. The cough is hacking. She has had associated post tussive emesis and intermittent mild headache. Pt states she has also been having decreased appetite. Denies fever. Denies sick contacts. Did not try anything at home for symptoms.   Past Medical History  Diagnosis Date  . Scoliosis    Past Surgical History  Procedure Laterality Date  . Tubal ligation     History reviewed. No pertinent family history. History  Substance Use Topics  . Smoking status: Current Every Day Smoker -- 0.50 packs/day    Types: Cigarettes  . Smokeless tobacco: Not on file  . Alcohol Use: Yes     Comment: occasionally   OB History   Grav Para Term Preterm Abortions TAB SAB Ect Mult Living                 Review of Systems  Constitutional: Positive for appetite change. Negative for fever.  Respiratory: Positive for cough.   Gastrointestinal: Positive for vomiting.  Neurological: Positive for headaches.  All other systems reviewed and are negative.  Allergies  Review of patient's allergies indicates no known allergies.  Home Medications   Prior to Admission medications   Medication Sig Start Date End Date Taking? Authorizing Provider  cyclobenzaprine (FLEXERIL) 10 MG tablet Take 1 tablet (10 mg total) by mouth 2 (two) times daily as needed for muscle spasms. 08/27/12   Roxy Horsemanobert Browning, PA-C  FLUoxetine (PROZAC) 20 MG capsule Take 20 mg by mouth  daily.    Historical Provider, MD  HYDROcodone-acetaminophen (NORCO/VICODIN) 5-325 MG per tablet Take 1 tablet by mouth every 6 (six) hours as needed for pain. 08/27/12   Roxy Horsemanobert Browning, PA-C  ibuprofen (ADVIL,MOTRIN) 600 MG tablet Take 1 tablet (600 mg total) by mouth every 6 (six) hours as needed for pain. 08/27/12   Roxy Horsemanobert Browning, PA-C  mirtazapine (REMERON) 15 MG tablet Take 15 mg by mouth at bedtime.    Historical Provider, MD   BP 96/61  Pulse 77  Temp(Src) 98.1 F (36.7 C) (Oral)  Resp 18  Ht 5\' 3"  (1.6 m)  Wt 130 lb (58.968 kg)  BMI 23.03 kg/m2  SpO2 100%  LMP 12/03/2013  Physical Exam  Nursing note and vitals reviewed. Constitutional: She is oriented to person, place, and time. She appears well-developed and well-nourished. No distress.  HENT:  Head: Normocephalic and atraumatic.  Eyes: EOM are normal.  Neck: Neck supple. No tracheal deviation present.  Cardiovascular: Normal rate, regular rhythm and normal heart sounds.   Pulmonary/Chest: Effort normal and breath sounds normal. No respiratory distress. She has no wheezes. She has no rhonchi. She has no rales.  Pt coughing throughout exam.  Musculoskeletal: Normal range of motion.  Neurological: She is alert and oriented to person, place, and time.  Skin: Skin is warm and dry.  Psychiatric: She has a normal mood and affect. Her behavior is normal.    ED Course  Procedures (including critical care time)  DIAGNOSTIC STUDIES: Oxygen Saturation is 100% on RA, normal by my interpretation.    COORDINATION OF CARE: 11:21 AM-Discussed treatment plan which includes an antibiotic with pt at bedside and pt agreed to plan.   Labs Review Labs Reviewed - No data to display  Imaging Review Dg Chest 2 View  12/21/2013   CLINICAL DATA:  33 year old female with cough. Initial encounter.  EXAM: CHEST  2 VIEW  COMPARISON:  08/27/2012 and earlier.  FINDINGS: Stable common normal lung volumes. Normal cardiac size and mediastinal  contours. Visualized tracheal air column is within normal limits. No pneumothorax, pulmonary edema, pleural effusion. Subtle increased lower lobe opacity on both views. This appears to affect both lower lobes. No overt consolidation. Mild scoliosis. No acute osseous abnormality identified.  IMPRESSION: Patchy bilateral lower lobe opacity suggestive of bronchopneumonia in this setting.   Electronically Signed   By: Augusto GambleLee  Hall M.D.   On: 12/21/2013 11:13     EKG Interpretation None      MDM   Final diagnoses:  CAP (community acquired pneumonia)    11:24 AM Patient has bilateral lower lobe opacity suggesting pneumonia. Patient's vitals stable and patient afebrile. Patient will be discharged with Levaquin and hycodan. Patient will have albuterol inhaler here. Patient instructed to return with worsening or concerning symptoms.   I personally performed the services described in this documentation, which was scribed in my presence. The recorded information has been reviewed and is accurate.  Emilia BeckKaitlyn Bulah Lurie, PA-C 12/21/13 1125

## 2013-12-21 NOTE — Discharge Instructions (Signed)
Take levaquin as directed until gone. Take hycodan as needed for cough. Refer to attached documents for more information. Return to the ED with worsening or concerning symptoms.

## 2013-12-21 NOTE — ED Notes (Signed)
She states for the past 3 days shes been coughing, aching all over her body, and had no appetite. She said she coughed so hard one time she vomited

## 2013-12-21 NOTE — Discharge Planning (Signed)
[facility] Community Liaison  Spoke to patient about establishing care with a provider. Patient sts she does have medicaid but unaware of the medical home listed on the card. Patient was given a list of providers in the area who accept medicaid. My contact information was also provided for any future questions or concerns.

## 2013-12-25 NOTE — ED Provider Notes (Signed)
Medical screening examination/treatment/procedure(s) were performed by non-physician practitioner and as supervising physician I was immediately available for consultation/collaboration.   EKG Interpretation None       Contrina Orona, MD 12/25/13 1339 

## 2015-04-05 ENCOUNTER — Emergency Department (HOSPITAL_COMMUNITY): Payer: Medicaid Other

## 2015-04-05 ENCOUNTER — Emergency Department (HOSPITAL_COMMUNITY)
Admission: EM | Admit: 2015-04-05 | Discharge: 2015-04-05 | Disposition: A | Discharge status: Home / Self Care | Payer: Medicaid Other | Attending: Emergency Medicine | Admitting: Emergency Medicine

## 2015-04-05 ENCOUNTER — Encounter (HOSPITAL_COMMUNITY): Payer: Self-pay | Admitting: *Deleted

## 2015-04-05 DIAGNOSIS — R600 Localized edema: Secondary | ICD-10-CM | POA: Insufficient documentation

## 2015-04-05 DIAGNOSIS — Z720 Tobacco use: Secondary | ICD-10-CM | POA: Insufficient documentation

## 2015-04-05 DIAGNOSIS — M79671 Pain in right foot: Secondary | ICD-10-CM | POA: Diagnosis present

## 2015-04-05 DIAGNOSIS — Z79899 Other long term (current) drug therapy: Secondary | ICD-10-CM | POA: Insufficient documentation

## 2015-04-05 MED ORDER — ACETAMINOPHEN 325 MG PO TABS
650.0000 mg | ORAL_TABLET | Freq: Once | ORAL | Status: AC
  Administered 2015-04-05: 650 mg via ORAL
  Filled 2015-04-05: qty 2

## 2015-04-05 MED ORDER — NAPROXEN 250 MG PO TABS: 250.0000 mg | ORAL_TABLET | Freq: Two times a day (BID) | ORAL | Status: DC

## 2015-04-05 NOTE — Discharge Instructions (Signed)
Foot Sprain The muscles and cord like structures which attach muscle to bone (tendons) that surround the feet are made up of units. A foot sprain can occur at the weakest spot in any of these units. This condition is most often caused by injury to or overuse of the foot, as from playing contact sports, or aggravating a previous injury, or from poor conditioning, or obesity. SYMPTOMS  Pain with movement of the foot.  Tenderness and swelling at the injury site.  Loss of strength is present in moderate or severe sprains. THE THREE GRADES OR SEVERITY OF FOOT SPRAIN ARE:  Mild (Grade I): Slightly pulled muscle without tearing of muscle or tendon fibers or loss of strength.  Moderate (Grade II): Tearing of fibers in a muscle, tendon, or at the attachment to bone, with small decrease in strength.  Severe (Grade III): Rupture of the muscle-tendon-bone attachment, with separation of fibers. Severe sprain requires surgical repair. Often repeating (chronic) sprains are caused by overuse. Sudden (acute) sprains are caused by direct injury or over-use. DIAGNOSIS  Diagnosis of this condition is usually by your own observation. If problems continue, a caregiver may be required for further evaluation and treatment. X-rays may be required to make sure there are not breaks in the bones (fractures) present. Continued problems may require physical therapy for treatment. PREVENTION  Use strength and conditioning exercises appropriate for your sport.  Warm up properly prior to working out.  Use athletic shoes that are made for the sport you are participating in.  Allow adequate time for healing. Early return to activities makes repeat injury more likely, and can lead to an unstable arthritic foot that can result in prolonged disability. Mild sprains generally heal in 3 to 10 days, with moderate and severe sprains taking 2 to 10 weeks. Your caregiver can help you determine the proper time required for  healing. HOME CARE INSTRUCTIONS   Apply ice to the injury for 15-20 minutes, 03-04 times per day. Put the ice in a plastic bag and place a towel between the bag of ice and your skin.  An elastic wrap (like an Ace bandage) may be used to keep swelling down.  Keep foot above the level of the heart, or at least raised on a footstool, when swelling and pain are present.  Try to avoid use other than gentle range of motion while the foot is painful. Do not resume use until instructed by your caregiver. Then begin use gradually, not increasing use to the point of pain. If pain does develop, decrease use and continue the above measures, gradually increasing activities that do not cause discomfort, until you gradually achieve normal use.  Use crutches if and as instructed, and for the length of time instructed.  Keep injured foot and ankle wrapped between treatments.  Massage foot and ankle for comfort and to keep swelling down. Massage from the toes up towards the knee.  Only take over-the-counter or prescription medicines for pain, discomfort, or fever as directed by your caregiver. SEEK IMMEDIATE MEDICAL CARE IF:   Your pain and swelling increase, or pain is not controlled with medications.  You have loss of feeling in your foot or your foot turns cold or blue.  You develop new, unexplained symptoms, or an increase of the symptoms that brought you to your caregiver. MAKE SURE YOU:   Understand these instructions.  Will watch your condition.  Will get help right away if you are not doing well or get worse. Document Released:   01/11/2002 Document Revised: 10/14/2011 Document Reviewed: 03/10/2008 Exit[employer] Patient Information 2015 [employer]. This information is not intended to replace advice given to you by your health care provider. Make sure you discuss any questions you have with your health care provider.  

## 2015-04-05 NOTE — ED Notes (Signed)
Patient presents with c/o pain to the right foot.  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

## 2015-04-05 NOTE — ED Provider Notes (Signed)
CSN: [id number]     Arrival date & time 04/05/15  2011 History  This chart was scribed for Everlene Farrier, PA-C, working with Gerhard Munch, MD by Octavia Heir, ED Scribe. This patient was seen in room TR05C/TR05C and the patient's care was started at 9:32 PM.      Chief Complaint  Patient presents with  . Foot Injury     The history is provided by the patient. No language interpreter was used.   HPI Comments: Tammy May is a 34 y.o. female who presents to the Emergency Department complaining of sudden onset, gradual worsening right foot pain onset yesterday. Pt states she woke up yesterday morning with right foot pain and states having associated swelling that happened this morning. Pt ranks her pain a 9/10 and states that her pain is worse when she ambulates. She reports taking ibuprofen this morning to alleviate the pain with no relief. Pt denies any known injury to her foot. She denies numbness or tingling.   Past Medical History  Diagnosis Date  . Scoliosis    Past Surgical History  Procedure Laterality Date  . Tubal ligation     No family history on file. Social History  Substance Use Topics  . Smoking status: Current Every Day Smoker -- 0.50 packs/day    Types: Cigarettes  . Smokeless tobacco: None  . Alcohol Use: Yes     Comment: occasionally   OB History    No data available     Review of Systems  Constitutional: Negative for fever.  Musculoskeletal: Positive for joint swelling and arthralgias. Negative for gait problem.  Skin: Negative for rash and wound.  Neurological: Negative for weakness and numbness.      Allergies  Review of patient's allergies indicates no known allergies.  Home Medications   Prior to Admission medications   Medication Sig Start Date End Date Taking? Authorizing Provider  FLUoxetine (PROZAC) 20 MG capsule Take 20 mg by mouth daily.    Historical Provider, MD  HYDROcodone-homatropine (HYCODAN) 5-1.5 MG/5ML syrup Take 5 mLs  by mouth every 6 (six) hours as needed. 12/21/13   Kaitlyn Szekalski, PA-C  levofloxacin (LEVAQUIN) 500 MG tablet Take 1 tablet (500 mg total) by mouth daily. 12/21/13   Kaitlyn Szekalski, PA-C  naproxen (NAPROSYN) 250 MG tablet Take 1 tablet (250 mg total) by mouth 2 (two) times daily with a meal. 04/05/15   Everlene Farrier, PA-C  QUEtiapine Fumarate (SEROQUEL XR) 150 MG 24 hr tablet Take 150 mg by mouth at bedtime.    Historical Provider, MD   Triage vitals: BP 120/70 mmHg  Pulse 75  Temp(Src) 98 F (36.7 C) (Oral)  Resp 18  Ht 5\' 3"  (1.6 m)  Wt 166 lb 1.6 oz (75.342 kg)  BMI 29.43 kg/m2  SpO2 100%  LMP 02/25/2015 Physical Exam  Constitutional: She appears well-developed and well-nourished. No distress.  Nontoxic appearing.  HENT:  Head: Normocephalic and atraumatic.  Eyes: Right eye exhibits no discharge. Left eye exhibits no discharge.  Cardiovascular: Normal rate and intact distal pulses.   Bilateral dorsalis pedis and posterior tibialis pulses are intact. Good capillary refill to her right distal toes.  Pulmonary/Chest: Effort normal. No respiratory distress.  Musculoskeletal: Normal range of motion. She exhibits edema and tenderness.  Mild edema to the second toe, tenderness to the second toe.  No foot edema, no ankle edema, good ROM of toes and ankles, able to ambulate without difficulty or assistance. No calf edema or tenderness. No ecchymosis.  No deformity.  Neurological: She is alert. Coordination normal.  Sensation is intact to her bilateral lower extremities.  Skin: Skin is warm and dry. No rash noted. She is not diaphoretic. No erythema. No pallor.  Psychiatric: She has a normal mood and affect. Her behavior is normal.  Nursing note and vitals reviewed.   ED Course  Procedures  DIAGNOSTIC STUDIES: Oxygen Saturation is 100% on RA, normal by my interpretation.  COORDINATION OF CARE:  9:35 PM Discussed treatment plan which includes work note, post op shoe and pain  medication with pt at bedside and pt agreed to plan.  Labs Review Labs Reviewed - No data to display  Imaging Review Dg Foot Complete Right  04/05/2015   CLINICAL DATA:  Plantar foot pain with weight-bearing.  No trauma.  EXAM: RIGHT FOOT COMPLETE - 3+ VIEW  COMPARISON:  None.  FINDINGS: There is prior second PIP arthrodesis, solidly ossified. There is first metatarsal osteotomy with 2 fixation wires. There is no acute fracture, dislocation or soft tissue foreign body. There is no significant arthritic change. There is no bone lesion or bony destruction.  IMPRESSION: No acute findings. Unremarkable postsurgical changes as detailed above.   Electronically Signed   By: Ellery Plunk M.D.   On: 04/05/2015 21:26   I have personally reviewed and evaluated these images and lab results as part of my medical decision-making.   EKG Interpretation None      Filed Vitals:   04/05/15 2028  BP: 120/70  Pulse: 75  Temp: 98 F (36.7 C)  TempSrc: Oral  Resp: 18  Height:  (1.6 m)  Weight: 166 lb 1.6 oz (75.342 kg)  SpO2: 100%     MDM   Meds given in ED:  Medications  acetaminophen (TYLENOL) tablet 650 mg (not administered)    New Prescriptions   NAPROXEN (NAPROSYN) 250 MG TABLET    Take 1 tablet (250 mg total) by mouth 2 (two) times daily with a meal.    Final diagnoses:  Right foot pain   This is a 34 year old female who presents to the emergency department complaining of right foot pain since yesterday. On exam the patient is afebrile and nontoxic appearing. The patient has a mild amount edema to her second toe as well as tenderness to her second toe. There is no foot deformity, or ecchymosis. Patient is neurovascularly intact. X-rays of her foot are unremarkable. We'll discharge with the postop shoe and have her follow up with primary care. Patient provided prescription for Naprosyn for pain control. I advised the patient to follow-up with their primary care provider this week. I  advised the patient to return to the emergency department with new or worsening symptoms or new concerns. The patient verbalized understanding and agreement with plan.    I personally performed the services described in this documentation, which was scribed in my presence. The recorded information has been reviewed and is accurate.    Everlene Farrier, PA-C 04/05/15 2145  Gerhard Munch, MD 04/06/15 [phone]

## 2015-04-06 NOTE — ED Notes (Addendum)
Pt left black LG cell phone charger in Rm after Discharge. Pt belongings placed in red bio-hazard bag with pt sticker and placed at registration desk for pick up per pt request.

## 2015-11-01 ENCOUNTER — Emergency Department (HOSPITAL_COMMUNITY): Payer: Medicaid Other

## 2015-11-01 ENCOUNTER — Encounter (HOSPITAL_COMMUNITY): Payer: Self-pay | Admitting: Emergency Medicine

## 2015-11-01 ENCOUNTER — Emergency Department (HOSPITAL_COMMUNITY)
Admission: EM | Admit: 2015-11-01 | Discharge: 2015-11-01 | Disposition: A | Discharge status: Home / Self Care | Payer: Medicaid Other | Attending: Emergency Medicine | Admitting: Emergency Medicine

## 2015-11-01 DIAGNOSIS — R112 Nausea with vomiting, unspecified: Secondary | ICD-10-CM | POA: Diagnosis not present

## 2015-11-01 DIAGNOSIS — R0989 Other specified symptoms and signs involving the circulatory and respiratory systems: Secondary | ICD-10-CM

## 2015-11-01 DIAGNOSIS — Z791 Long term (current) use of non-steroidal anti-inflammatories (NSAID): Secondary | ICD-10-CM | POA: Insufficient documentation

## 2015-11-01 DIAGNOSIS — R05 Cough: Secondary | ICD-10-CM

## 2015-11-01 DIAGNOSIS — J40 Bronchitis, not specified as acute or chronic: Secondary | ICD-10-CM

## 2015-11-01 DIAGNOSIS — Z79899 Other long term (current) drug therapy: Secondary | ICD-10-CM | POA: Insufficient documentation

## 2015-11-01 DIAGNOSIS — Z792 Long term (current) use of antibiotics: Secondary | ICD-10-CM | POA: Insufficient documentation

## 2015-11-01 DIAGNOSIS — F17210 Nicotine dependence, cigarettes, uncomplicated: Secondary | ICD-10-CM | POA: Diagnosis not present

## 2015-11-01 DIAGNOSIS — J209 Acute bronchitis, unspecified: Secondary | ICD-10-CM | POA: Diagnosis not present

## 2015-11-01 DIAGNOSIS — R053 Chronic cough: Secondary | ICD-10-CM

## 2015-11-01 DIAGNOSIS — M419 Scoliosis, unspecified: Secondary | ICD-10-CM | POA: Diagnosis not present

## 2015-11-01 MED ORDER — ONDANSETRON 4 MG PO TBDP
4.0000 mg | ORAL_TABLET | Freq: Once | ORAL | Status: AC
Start: 2015-11-01 — End: 2015-11-01
  Administered 2015-11-01: 4 mg via ORAL
  Filled 2015-11-01: qty 1

## 2015-11-01 MED ORDER — ALBUTEROL SULFATE HFA 108 (90 BASE) MCG/ACT IN AERS: 1.0000 | INHALATION_SPRAY | Freq: Four times a day (QID) | RESPIRATORY_TRACT | Status: DC | PRN

## 2015-11-01 MED ORDER — ALBUTEROL SULFATE (2.5 MG/3ML) 0.083% IN NEBU
INHALATION_SOLUTION | RESPIRATORY_TRACT | Status: AC
  Filled 2015-11-01: qty 6

## 2015-11-01 MED ORDER — ALBUTEROL SULFATE (2.5 MG/3ML) 0.083% IN NEBU
5.0000 mg | INHALATION_SOLUTION | Freq: Once | RESPIRATORY_TRACT | Status: AC
  Administered 2015-11-01: 5 mg via RESPIRATORY_TRACT

## 2015-11-01 MED ORDER — AZITHROMYCIN 250 MG PO TABS: 250.0000 mg | ORAL_TABLET | Freq: Every day | ORAL | Status: DC

## 2015-11-01 NOTE — ED Provider Notes (Signed)
CSN: [id number]     Arrival date & time 11/01/15  2031 History  By signing my name below, I, Tanda RockersMargaux Venter, attest that this documentation has been prepared under the direction and in the presence of Langston MaskerKaren Mariyanna Mucha, [place]PA-C.  Electronically Signed: Tanda RockersMargaux Venter, ED Scribe. 11/01/2015. 10:01 PM.   Chief Complaint  Patient presents with  . Cough  . Generalized Body Aches   The history is provided by the patient. No language interpreter was used.     HPI Comments: Terence Luxshley J Garlick is a 35 y.o. female who presents to the Emergency Department complaining of gradual onset, constant, productive cough x 3-4 days. Pt also complains of generalized body aches, chest pain with coughing, nausea, vomiting, and shortness of breath. Pt received a breathing treatment in the ED without relief. She is a current everyday smoker. Denies fever, chills, or any other associated symptoms.   Past Medical History  Diagnosis Date  . Scoliosis    Past Surgical History  Procedure Laterality Date  . Tubal ligation     No family history on file. Social History  Substance Use Topics  . Smoking status: Current Every Day Smoker -- 0.00 packs/day    Types: Cigarettes  . Smokeless tobacco: None  . Alcohol Use: Yes     Comment: occasionally   OB History    No data available     Review of Systems  Constitutional: Negative for fever and chills.  Respiratory: Positive for cough and shortness of breath.   Gastrointestinal: Positive for nausea and vomiting.  Musculoskeletal: Positive for myalgias.  All other systems reviewed and are negative.  Allergies  Review of patient's allergies indicates no known allergies.  Home Medications   Prior to Admission medications   Medication Sig Start Date End Date Taking? Authorizing Provider  FLUoxetine (PROZAC) 20 MG capsule Take 20 mg by mouth daily.    Historical Provider, MD  HYDROcodone-homatropine (HYCODAN) 5-1.5 MG/5ML syrup Take 5 mLs by mouth every 6 (six) hours as  needed. 12/21/13   Kaitlyn Szekalski, PA-C  levofloxacin (LEVAQUIN) 500 MG tablet Take 1 tablet (500 mg total) by mouth daily. 12/21/13   Kaitlyn Szekalski, PA-C  naproxen (NAPROSYN) 250 MG tablet Take 1 tablet (250 mg total) by mouth 2 (two) times daily with a meal. 04/05/15   Everlene FarrierWilliam Dansie, PA-C  QUEtiapine Fumarate (SEROQUEL XR) 150 MG 24 hr tablet Take 150 mg by mouth at bedtime.    Historical Provider, MD   BP 126/70 mmHg  Pulse 96  Temp(Src) 97.5 F (36.4 C) (Oral)  Resp 22  Ht 5\' 5"  (1.651 m)  Wt 160 lb 6 oz (72.746 kg)  BMI 26.69 kg/m2  SpO2 98%  LMP 10/25/2015 (Approximate)   Physical Exam  Constitutional: She is oriented to person, place, and time. She appears well-developed and well-nourished. No distress.  HENT:  Head: Normocephalic and atraumatic.  Eyes: Conjunctivae and EOM are normal.  Neck: Neck supple. No tracheal deviation present.  Cardiovascular: Normal rate.   Pulmonary/Chest: Effort normal. No respiratory distress. She has no wheezes. She has no rhonchi. She has no rales.  Harsh breath sounds  Musculoskeletal: Normal range of motion.  Neurological: She is alert and oriented to person, place, and time.  Skin: Skin is warm and dry.  Psychiatric: She has a normal mood and affect. Her behavior is normal.  Nursing note and vitals reviewed.   ED Course  Procedures (including critical care time)  DIAGNOSTIC STUDIES: Oxygen Saturation is 98% on RA, normal  by my interpretation.    COORDINATION OF CARE: 9:58 PM-Discussed treatment plan which includes Rx antibiotics and inhaler with pt at bedside and pt agreed to plan.   Labs Review Labs Reviewed - No data to display  Imaging Review Dg Chest 2 View  11/01/2015  CLINICAL DATA:  Persistent cough and chest congestion. EXAM: CHEST  2 VIEW COMPARISON:  12/21/2013 chest radiograph. FINDINGS: Stable cardiomediastinal silhouette with normal heart size. No pneumothorax. No pleural effusion. There is peribronchial cuffing  with linear interstitial prominence in the parahilar lungs, most prominent in the lower lungs. No acute consolidative airspace disease. IMPRESSION: 1. No acute consolidative airspace disease to suggest a pneumonia. 2. Peribronchial cuffing with linear interstitial prominence in the parahilar lungs, most prominent in the lower lungs, most suggestive of bronchitis. Lower lobe bronchiectasis cannot be excluded. Consider further evaluation on a short term outpatient basis with high-resolution chest CT study. Electronically Signed   By: Delbert Phenix M.D.   On: 11/01/2015 21:35   I have personally reviewed and evaluated these images as part of my medical decision-making.   EKG Interpretation None      MDM   Final diagnoses:  Bronchitis   Pt is PERC negative. Chest x ray shows probable bronchitis. Will treat with Zithromax and albuterol inhaler. Pt given Zofran here and encouraged to drink fluids. [facility] in 1 week. Pt advised Tylenol and Ibuprofen for myalgias.    An After Visit Summary was printed and given to the patient. I personally performed the services in this documentation, which was scribed in my presence.  The recorded information has been reviewed and considered.   Barnet Pall.    Lonia Skinner Bailey, PA-C 11/01/15 2234  Benjiman Core, MD 11/02/15 [phone]

## 2015-11-01 NOTE — ED Notes (Signed)
Pt. reports persistent productive cough with chest congestion , generalized body aches , headache , SOB and fatigue onset this week . Denies fever or chills.

## 2015-11-01 NOTE — Discharge Instructions (Signed)

## 2016-06-17 ENCOUNTER — Encounter (INDEPENDENT_AMBULATORY_CARE_PROVIDER_SITE_OTHER): Payer: Self-pay | Admitting: Orthopaedic Surgery

## 2016-06-17 ENCOUNTER — Ambulatory Visit (INDEPENDENT_AMBULATORY_CARE_PROVIDER_SITE_OTHER): Payer: Medicaid Other

## 2016-06-17 ENCOUNTER — Ambulatory Visit (INDEPENDENT_AMBULATORY_CARE_PROVIDER_SITE_OTHER): Payer: Medicaid Other | Admitting: Orthopaedic Surgery

## 2016-06-17 ENCOUNTER — Ambulatory Visit (INDEPENDENT_AMBULATORY_CARE_PROVIDER_SITE_OTHER): Payer: Self-pay

## 2016-06-17 DIAGNOSIS — M79642 Pain in left hand: Secondary | ICD-10-CM

## 2016-06-17 DIAGNOSIS — M79641 Pain in right hand: Secondary | ICD-10-CM | POA: Diagnosis not present

## 2016-06-17 DIAGNOSIS — S63639S Sprain of interphalangeal joint of unspecified finger, sequela: Secondary | ICD-10-CM
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
## 2016-08-29 ENCOUNTER — Encounter (INDEPENDENT_AMBULATORY_CARE_PROVIDER_SITE_OTHER): Payer: Self-pay | Admitting: *Deleted

## 2016-08-30 ENCOUNTER — Telehealth (INDEPENDENT_AMBULATORY_CARE_PROVIDER_SITE_OTHER): Payer: Self-pay | Admitting: *Deleted

## 2016-08-30 NOTE — Telephone Encounter (Signed)
[facility] stating they have left message with pt on 08/23/16, 08/26/16 and 08/29/16 to please call and schedule with Dr. Janee Mornhompson, Will hold referral until pt returns call.

## 2016-09-27 ENCOUNTER — Encounter (HOSPITAL_COMMUNITY): Payer: Self-pay | Admitting: Emergency Medicine

## 2016-09-27 ENCOUNTER — Emergency Department (HOSPITAL_COMMUNITY)
Admission: EM | Admit: 2016-09-27 | Discharge: 2016-09-27 | Disposition: A | Discharge status: Home / Self Care | Payer: Medicaid Other | Attending: Emergency Medicine | Admitting: Emergency Medicine

## 2016-09-27 DIAGNOSIS — Y9389 Activity, other specified: Secondary | ICD-10-CM | POA: Diagnosis not present

## 2016-09-27 DIAGNOSIS — Y999 Unspecified external cause status: Secondary | ICD-10-CM | POA: Insufficient documentation

## 2016-09-27 DIAGNOSIS — F17210 Nicotine dependence, cigarettes, uncomplicated: Secondary | ICD-10-CM | POA: Insufficient documentation

## 2016-09-27 DIAGNOSIS — M545 Low back pain, unspecified: Secondary | ICD-10-CM

## 2016-09-27 DIAGNOSIS — Y92410 Unspecified street and highway as the place of occurrence of the external cause: Secondary | ICD-10-CM | POA: Insufficient documentation

## 2016-09-27 DIAGNOSIS — S161XXA Strain of muscle, fascia and tendon at neck level, initial encounter: Secondary | ICD-10-CM | POA: Diagnosis not present

## 2016-09-27 DIAGNOSIS — S199XXA Unspecified injury of neck, initial encounter: Secondary | ICD-10-CM | POA: Diagnosis present

## 2016-09-27 MED ORDER — METHOCARBAMOL 500 MG PO TABS: 500.0000 mg | ORAL_TABLET | Freq: Every evening | ORAL | 0 refills | Status: DC | PRN

## 2016-09-27 MED ORDER — IBUPROFEN 600 MG PO TABS: 600.0000 mg | ORAL_TABLET | Freq: Four times a day (QID) | ORAL | 0 refills | Status: DC | PRN

## 2016-09-27 MED ORDER — KETOROLAC TROMETHAMINE 60 MG/2ML IM SOLN
60.0000 mg | Freq: Once | INTRAMUSCULAR | Status: AC
  Administered 2016-09-27: 60 mg via INTRAMUSCULAR
  Filled 2016-09-27: qty 2

## 2016-09-27 MED ORDER — METHOCARBAMOL 500 MG PO TABS
1000.0000 mg | ORAL_TABLET | Freq: Once | ORAL | Status: AC
  Administered 2016-09-27: 1000 mg via ORAL
  Filled 2016-09-27: qty 2

## 2016-09-27 NOTE — Discharge Instructions (Signed)
Take Ibuprofen for the next week. Take this medicine with food. Take muscle relaxer at bedtime to help you sleep. This medicine makes you drowsy so do not take before driving or work Use a heating pad for sore muscles - use for 20 minutes several times a day
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
## 2017-03-31 ENCOUNTER — Emergency Department (HOSPITAL_COMMUNITY): Payer: Medicaid Other

## 2017-03-31 ENCOUNTER — Encounter (HOSPITAL_COMMUNITY): Payer: Self-pay | Admitting: Emergency Medicine

## 2017-03-31 DIAGNOSIS — J40 Bronchitis, not specified as acute or chronic: Secondary | ICD-10-CM | POA: Insufficient documentation

## 2017-03-31 DIAGNOSIS — R0602 Shortness of breath: Secondary | ICD-10-CM | POA: Insufficient documentation

## 2017-03-31 DIAGNOSIS — R05 Cough: Secondary | ICD-10-CM | POA: Insufficient documentation

## 2017-03-31 DIAGNOSIS — F17210 Nicotine dependence, cigarettes, uncomplicated: Secondary | ICD-10-CM | POA: Insufficient documentation

## 2017-03-31 DIAGNOSIS — R531 Weakness: Secondary | ICD-10-CM | POA: Diagnosis not present

## 2017-03-31 DIAGNOSIS — R079 Chest pain, unspecified: Secondary | ICD-10-CM | POA: Diagnosis present

## 2017-03-31 LAB — URINALYSIS, ROUTINE W REFLEX MICROSCOPIC
Bilirubin Urine: NEGATIVE
GLUCOSE, UA: NEGATIVE mg/dL
Ketones, ur: 20 mg/dL — AB
Nitrite: NEGATIVE
PROTEIN: 100 mg/dL — AB
Specific Gravity, Urine: 1.027 (ref 1.005–1.030)
pH: 7 (ref 5.0–8.0)

## 2017-03-31 LAB — I-STAT BETA HCG BLOOD, ED (MC, WL, AP ONLY)

## 2017-03-31 LAB — CBC
HCT: 39.9 % (ref 36.0–46.0)
HEMOGLOBIN: 13.3 g/dL (ref 12.0–15.0)
MCH: 28.9 pg (ref 26.0–34.0)
MCHC: 33.3 g/dL (ref 30.0–36.0)
MCV: 86.7 fL (ref 78.0–100.0)
PLATELETS: 254 10*3/uL (ref 150–400)
RBC: 4.6 MIL/uL (ref 3.87–5.11)
RDW: 12.4 % (ref 11.5–15.5)
WBC: 9.7 10*3/uL (ref 4.0–10.5)

## 2017-03-31 LAB — COMPREHENSIVE METABOLIC PANEL
ALBUMIN: 3.9 g/dL (ref 3.5–5.0)
ALK PHOS: 63 U/L (ref 38–126)
ALT: 14 U/L (ref 14–54)
AST: 18 U/L (ref 15–41)
Anion gap: 11 (ref 5–15)
BUN: 7 mg/dL (ref 6–20)
CALCIUM: 8.9 mg/dL (ref 8.9–10.3)
CO2: 21 mmol/L — AB (ref 22–32)
CREATININE: 0.69 mg/dL (ref 0.44–1.00)
Chloride: 104 mmol/L (ref 101–111)
GFR calc Af Amer: >60 mL/min (ref >60)
GFR calc non Af Amer: >60 mL/min (ref >60)
GLUCOSE: 96 mg/dL (ref 65–99)
Potassium: 3.3 mmol/L — ABNORMAL LOW (ref 3.5–5.1)
SODIUM: 136 mmol/L (ref 135–145)
Total Bilirubin: 1 mg/dL (ref 0.3–1.2)
Total Protein: 7.1 g/dL (ref 6.5–8.1)

## 2017-03-31 LAB — LIPASE, BLOOD: Lipase: 21 U/L (ref 11–51)

## 2017-03-31 LAB — I-STAT TROPONIN, ED: TROPONIN I, POC: 0 ng/mL (ref 0.00–0.08)

## 2017-03-31 MED ORDER — ONDANSETRON 4 MG PO TBDP
ORAL_TABLET | ORAL | Status: AC
  Filled 2017-03-31: qty 1

## 2017-03-31 MED ORDER — ONDANSETRON 4 MG PO TBDP
4.0000 mg | ORAL_TABLET | Freq: Once | ORAL | Status: AC | PRN
  Administered 2017-03-31: 4 mg via ORAL

## 2017-03-31 NOTE — ED Triage Notes (Signed)
Pt c/o generalized weakness, vomiting, chills since last Friday today she is having a 8/10 central cp that started today. Pt AO x 4, NAD noticed.

## 2017-04-01 ENCOUNTER — Emergency Department (HOSPITAL_COMMUNITY)
Admission: EM | Admit: 2017-04-01 | Discharge: 2017-04-01 | Disposition: A | Discharge status: Home / Self Care | Payer: Medicaid Other | Attending: Emergency Medicine | Admitting: Emergency Medicine

## 2017-04-01 DIAGNOSIS — J40 Bronchitis, not specified as acute or chronic: Secondary | ICD-10-CM

## 2017-04-01 MED ORDER — HYDROCODONE-HOMATROPINE 5-1.5 MG/5ML PO SYRP: 5.0000 mL | ORAL_SOLUTION | Freq: Four times a day (QID) | ORAL | 0 refills | Status: DC | PRN

## 2017-04-01 MED ORDER — PREDNISONE 20 MG PO TABS: 40.0000 mg | ORAL_TABLET | Freq: Every day | ORAL | 0 refills | Status: DC

## 2017-04-01 MED ORDER — BENZONATATE 100 MG PO CAPS: 100.0000 mg | ORAL_CAPSULE | Freq: Three times a day (TID) | ORAL | 0 refills | Status: DC

## 2017-04-01 MED ORDER — PREDNISONE 20 MG PO TABS
60.0000 mg | ORAL_TABLET | Freq: Once | ORAL | Status: AC
  Administered 2017-04-01: 60 mg via ORAL
  Filled 2017-04-01: qty 3

## 2017-04-01 MED ORDER — IPRATROPIUM-ALBUTEROL 0.5-2.5 (3) MG/3ML IN SOLN
3.0000 mL | Freq: Once | RESPIRATORY_TRACT | Status: AC
  Administered 2017-04-01: 3 mL via RESPIRATORY_TRACT
  Filled 2017-04-01: qty 3

## 2017-04-01 MED ORDER — ALBUTEROL SULFATE HFA 108 (90 BASE) MCG/ACT IN AERS: 2.0000 | INHALATION_SPRAY | RESPIRATORY_TRACT | 2 refills | Status: DC | PRN

## 2017-04-01 NOTE — ED Provider Notes (Signed)
[facility] Provider Note   CSN: [id number] Arrival date & time: 03/31/17  1813     History   Chief Complaint Chief Complaint  Patient presents with  . Emesis  . Cough  . Chest Pain    HPI Tammy May is a 36 y.o. female.  Patient presents to the ER for evaluation of generalized weakness, chills, malaise, cough. Symptoms began 3 days ago. She reports persistentharsh cough that is so severe that causes her to vomit. She has now begun to have pain in the center of her chest with shortness of breath.      Past Medical History:  Diagnosis Date  . Scoliosis     Patient Active Problem List   Diagnosis Date Noted  . [place] finger, sequela 06/17/2016    Past Surgical History:  Procedure Laterality Date  . TUBAL LIGATION      OB History    No data available       Home Medications    Prior to Admission medications   Medication Sig Start Date End Date Taking? Authorizing Provider  albuterol (PROVENTIL HFA;VENTOLIN HFA) 108 (90 Base) MCG/ACT inhaler Inhale 1-2 puffs into the lungs every 6 (six) hours as needed for wheezing or shortness of breath. Patient not taking: Reported on 04/01/2017 11/01/15   Elson Areas, PA-C  azithromycin (ZITHROMAX) 250 MG tablet Take 1 tablet (250 mg total) by mouth daily. Take first 2 tablets together, then 1 every day until finished. Patient not taking: Reported on 04/01/2017 11/01/15   Elson Areas, PA-C  HYDROcodone-homatropine [facility]) 5-1.5 MG/5ML syrup Take 5 mLs by mouth every 6 (six) hours as needed. Patient not taking: Reported on 04/01/2017 12/21/13   Emilia Beck, PA-C  ibuprofen (ADVIL,MOTRIN) 600 MG tablet Take 1 tablet (600 mg total) by mouth every 6 (six) hours as needed. Patient not taking: Reported on 04/01/2017 09/27/16   Bethel Born, PA-C  levofloxacin (LEVAQUIN) 500 MG tablet Take 1 tablet (500 mg total) by mouth daily. Patient not taking: Reported on 04/01/2017 12/21/13   Emilia Beck, PA-C    methocarbamol (ROBAXIN) 500 MG tablet Take 1 tablet (500 mg total) by mouth at bedtime and may repeat dose one time if needed. Patient not taking: Reported on 04/01/2017 09/27/16   Bethel Born, PA-C  naproxen (NAPROSYN) 250 MG tablet Take 1 tablet (250 mg total) by mouth 2 (two) times daily with a meal. Patient not taking: Reported on 04/01/2017 04/05/15   Everlene Farrier, PA-C    Family History No family history on file.  Social History Social History  Substance Use Topics  . Smoking status: Current Every Day Smoker    Packs/day: 0.50    Types: Cigarettes  . Smokeless tobacco: Never Used  . Alcohol use Yes     Comment: occasionally     Allergies   Patient has no known allergies.   Review of Systems Review of Systems  Constitutional: Positive for chills and fatigue.  Respiratory: Positive for cough and shortness of breath.   Cardiovascular: Positive for chest pain.  Musculoskeletal: Positive for myalgias.  All other systems reviewed and are negative.    Physical Exam Updated Vital Signs BP 112/64 (BP Location: Left Arm)   Pulse 83   Temp 98.5 F (36.9 C) (Oral)   Resp 14   Ht 5\' 3"  (1.6 m)   Wt 75.3 kg (166 lb)   LMP 02/28/2017   SpO2 100%   BMI 29.41 kg/m   Physical Exam  Constitutional: She is oriented to person, place, and time. She appears well-developed and well-nourished. No distress.  HENT:  Head: Normocephalic and atraumatic.  Right Ear: Hearing normal.  Left Ear: Hearing normal.  Nose: Nose normal.  Mouth/Throat: Oropharynx is clear and moist and mucous membranes are normal.  Eyes: Pupils are equal, round, and reactive to light. Conjunctivae and EOM are normal.  Neck: Normal range of motion. Neck supple.  Cardiovascular: Regular rhythm, S1 normal and S2 normal.  Exam reveals no gallop and no friction rub.   No murmur heard. Pulmonary/Chest: Effort normal. No respiratory distress. She has wheezes (slight, at bases). She exhibits no tenderness.   Abdominal: Soft. Normal appearance and bowel sounds are normal. There is no hepatosplenomegaly. There is no tenderness. There is no rebound, no guarding, no tenderness at McBurney's point and negative Murphy's sign. No hernia.  Musculoskeletal: Normal range of motion.  Neurological: She is alert and oriented to person, place, and time. She has normal strength. No cranial nerve deficit or sensory deficit. Coordination normal. GCS eye subscore is 4. GCS verbal subscore is 5. GCS motor subscore is 6.  Skin: Skin is warm, dry and intact. No rash noted. No cyanosis.  Psychiatric: She has a normal mood and affect. Her speech is normal and behavior is normal. Thought content normal.  Nursing note and vitals reviewed.    ED Treatments / Results  Labs (all labs ordered are listed, but only abnormal results are displayed) Labs Reviewed  COMPREHENSIVE METABOLIC PANEL - Abnormal; Notable for the following:       Result Value   Potassium 3.3 (*)    CO2 21 (*)    All other components within normal limits  URINALYSIS, ROUTINE W REFLEX MICROSCOPIC - Abnormal; Notable for the following:    APPearance HAZY (*)    Hgb urine dipstick LARGE (*)    Ketones, ur 20 (*)    Protein, ur 100 (*)    Leukocytes, UA TRACE (*)    Bacteria, UA RARE (*)    Squamous Epithelial / LPF 0-5 (*)    All other components within normal limits  LIPASE, BLOOD  CBC  I-STAT TROPONIN, ED  I-STAT BETA HCG BLOOD, ED ([facility], [facility], [facility] ONLY)    EKG  EKG Interpretation  Date/Time:  Monday March 31 2017 19:25:41 EDT Ventricular Rate:  92 PR Interval:  174 QRS Duration: 74 QT Interval:  362 QTC Calculation: 447 R Axis:   90 Text Interpretation:  Normal sinus rhythm Rightward axis Borderline ECG Non-specific ST-t changes Confirmed by Margarita Grizzle [phone]) on 03/31/2017 7:37:37 PM       Radiology Dg Chest 2 View  Result Date: 03/31/2017 CLINICAL DATA:  Generalized weakness and chest pain since last Friday. EXAM: CHEST  2 VIEW  COMPARISON:  04/02/2016. FINDINGS: The heart size and mediastinal contours are within normal limits. Both lungs are clear. Mild interstitial prominence is again noted bilaterally which may reflect chronic bronchitic change. Tiny granuloma in the left upper lobe is suspected The visualized skeletal structures are unremarkable. IMPRESSION: Chronic bronchitic change of the lungs. No pneumonic consolidation, CHF nor effusion. Electronically Signed   By: Tollie Eth M.D.   On: 03/31/2017 20:33    Procedures Procedures (including critical care time)  Medications Ordered in ED Medications  ondansetron (ZOFRAN-ODT) 4 MG disintegrating tablet (not administered)  ipratropium-albuterol (DUONEB) 0.5-2.5 (3) MG/3ML nebulizer solution 3 mL (not administered)  predniSONE (DELTASONE) tablet 60 mg (not administered)  ondansetron (ZOFRAN-ODT) disintegrating tablet 4 mg (  4 mg Oral Given 03/31/17 1916)     Initial Impression / Assessment and Plan / ED Course  I have reviewed the triage vital signs and the nursing notes.  Pertinent labs & imaging results that were available during my care of the patient were reviewed by me and considered in my medical decision making (see chart for details).     Patient presents to the ER with upper respiratory infection symptoms. She has had a cough for the last 3 days that has progressively worsened. Examination is consistent with very mild bronchospasm. She is not hypoxic. All vital signs are normal. Chest x-ray does not show pneumonia, only changes of bronchitis. Lab work was unremarkable.  Patient reassured, symptoms likely viral in nature. She was given a DuoNeb with some improvement. She will continue albuterol inhaler, prednisone, Hycodan, guaifenesin.  Final Clinical Impressions(s) / ED Diagnoses   Final diagnoses:  Bronchitis    New Prescriptions New Prescriptions   No medications on file     Gilda Crease, MD 04/01/17 [phone]

## 2018-07-20 ENCOUNTER — Ambulatory Visit (INDEPENDENT_AMBULATORY_CARE_PROVIDER_SITE_OTHER): Payer: Medicaid Other | Admitting: Internal Medicine

## 2018-07-20 ENCOUNTER — Other Ambulatory Visit: Payer: Self-pay

## 2018-07-20 ENCOUNTER — Encounter: Payer: Self-pay | Admitting: Internal Medicine

## 2018-07-20 VITALS — BP 96/73 | HR 100 | Temp 98.5°F | Ht 63.0 in | Wt 172.7 lb

## 2018-07-20 DIAGNOSIS — F17210 Nicotine dependence, cigarettes, uncomplicated: Secondary | ICD-10-CM

## 2018-07-20 DIAGNOSIS — A5901 Trichomonal vulvovaginitis: Secondary | ICD-10-CM | POA: Diagnosis not present

## 2018-07-20 DIAGNOSIS — Z8742 Personal history of other diseases of the female genital tract: Secondary | ICD-10-CM | POA: Diagnosis not present

## 2018-07-20 DIAGNOSIS — N760 Acute vaginitis: Secondary | ICD-10-CM

## 2018-07-20 NOTE — Progress Notes (Signed)
CC: Establish Care, Vaginal discharge and odor  HPI:  [name] is a 37 y.o. F with PMHx listed below presenting for Establish Care, Vaginal discharge and odor. Please see the A&P for the status of the patient's chronic medical problems.   Past Medical History:  Diagnosis Date  . Depression   . Scoliosis    Past Surgical History:  Procedure Laterality Date  . TUBAL LIGATION     Social History   Socioeconomic History  . Marital status: Single    Spouse name: Not on file  . Number of children: Not on file  . Years of education: Not on file  . Highest education level: Not on file  Occupational History  . Not on file  Social Needs  . Financial resource strain: Not on file  . Food insecurity:    Worry: Not on file    Inability: Not on file  . Transportation needs:    Medical: Not on file    Non-medical: Not on file  Tobacco Use  . Smoking status: Current Every Day Smoker    Packs/day: 0.50    Types: Cigarettes  . Smokeless tobacco: Never Used  . Tobacco comment: Cutting Back  Substance and Sexual Activity  . Alcohol use: Yes    Comment: occasionally  . Drug use: Yes    Types: Marijuana    Comment: Daily, Cutting Back  . Sexual activity: Not on file  Lifestyle  . Physical activity:    Days per week: Not on file    Minutes per session: Not on file  . Stress: Not on file  Relationships  . Social connections:    Talks on phone: Not on file    Gets together: Not on file    Attends religious service: Not on file    Active member of club or organization: Not on file    Attends meetings of clubs or organizations: Not on file    Relationship status: Not on file  Other Topics Concern  . Not on file  Social History Narrative  . Not on file   Family History  Problem Relation Age of Onset  . Cancer Father   . Stroke Maternal Grandfather     Review of Systems:  Performed and all others negative.  Physical Exam:  Vitals:   07/20/18 1423  BP: 96/73    Pulse: 100  Temp: 98.5 F (36.9 C)  TempSrc: Oral  SpO2: 100%  Weight: 172 lb 11.2 oz (78.3 kg)  Height: 5\' 3"  (1.6 m)   Physical Exam Constitutional:      Appearance: Normal appearance. She is not ill-appearing.  Cardiovascular:     Rate and Rhythm: Normal rate and regular rhythm.     Pulses: Normal pulses.     Heart sounds: Normal heart sounds.  Pulmonary:     Effort: Pulmonary effort is normal. No respiratory distress.     Breath sounds: Normal breath sounds.  Abdominal:     General: Bowel sounds are normal. There is no distension.     Palpations: Abdomen is soft.     Tenderness: There is no abdominal tenderness.  Genitourinary:    General: Normal vulva.     Exam position: Lithotomy position.     Pubic Area: No rash or pubic lice.      Vagina: Vaginal discharge present.  Musculoskeletal:        General: No swelling or deformity.  Skin:    General: Skin is warm and dry.  Assessment & Plan:   See Encounters Tab for problem based charting.  Patient seen with Dr. Angelia Mould

## 2018-07-20 NOTE — Patient Instructions (Addendum)
Thank you for allowing us to care for you  For your vaginal symptoms  - We have ordered lab studies on the sample obtained today - We will ordered a prescription for you based on these results

## 2018-07-21 ENCOUNTER — Telehealth: Payer: Self-pay | Admitting: Internal Medicine

## 2018-07-21 ENCOUNTER — Encounter: Payer: Self-pay | Admitting: Internal Medicine

## 2018-07-21 DIAGNOSIS — N760 Acute vaginitis: Secondary | ICD-10-CM

## 2018-07-21 LAB — CERVICOVAGINAL ANCILLARY ONLY
BACTERIAL VAGINITIS: POSITIVE — AB
CANDIDA VAGINITIS: NEGATIVE
Chlamydia: NEGATIVE
NEISSERIA GONORRHEA: NEGATIVE
TRICH (WINDOWPATH): POSITIVE — AB

## 2018-07-21 MED ORDER — METRONIDAZOLE 500 MG PO TABS: 500.0000 mg | ORAL_TABLET | Freq: Two times a day (BID) | ORAL | 0 refills | Status: AC

## 2018-07-21 NOTE — Telephone Encounter (Addendum)
Placed call to patient to inform her of her test results positive for Gardnerella Vaginalis and Trichomonas. A prescription for metronidazole to be taken twice a day for 7 days has been sent to her most recent pharmacy [facility] [phone]#[id number] - [place], [place] - [address]).  Patient's sexual partner(s) should also be treated for trichomonas with a single 2g PO dose preferably.  Alcohol should be avoided while taking this medication  Ginette OttoAlec Melvin, DO IM PGY-2

## 2018-07-21 NOTE — Assessment & Plan Note (Addendum)
Patient presenting with symptoms of vignal discharge and odor. States symptoms are similar to previous bacterial vaginosis infection. Speculum exam performed and vaginal swab obtained and sent for analysis, will likely treat for presumed BV pending laboratory results. Will also screen for GC/Chlamydia and Trichomonas. - Cervicovaginal swab for BV, GC/Chlamydia, Trichomonas. - Will notify patient of results and treat with metronidazole 500mg  BID x7d if positive  ADDENDUM: Positive for BV and Trichomonas, patient notified and prescription sent, please see separate telephone encouter

## 2018-07-22 ENCOUNTER — Telehealth: Payer: Self-pay | Admitting: *Deleted
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
## 2018-07-22 NOTE — Telephone Encounter (Signed)
Closing encounter

## 2018-07-22 NOTE — Telephone Encounter (Signed)
Attempted to call patient again this morning, reached voicemail.

## 2018-07-22 NOTE — Progress Notes (Signed)
Internal Medicine Clinic Attending  I saw and evaluated the patient.  I personally confirmed the key portions of the history and exam documented by Dr. Alinda MoneyMelvin and I reviewed pertinent patient test results.  The assessment, diagnosis, and plan were formulated together and I agree with the documentation in the resident's note.     Positive for BV and trich, needs tx with flagyl x7 days, partner also needs tx for trich

## 2018-09-05 ENCOUNTER — Encounter (HOSPITAL_COMMUNITY): Payer: Self-pay | Admitting: Emergency Medicine

## 2018-09-05 ENCOUNTER — Emergency Department (HOSPITAL_COMMUNITY)
Admission: EM | Admit: 2018-09-05 | Discharge: 2018-09-06 | Disposition: A | Discharge status: Home / Self Care | Payer: Medicaid Other | Attending: Emergency Medicine | Admitting: Emergency Medicine

## 2018-09-05 DIAGNOSIS — R0781 Pleurodynia: Secondary | ICD-10-CM | POA: Insufficient documentation

## 2018-09-05 NOTE — ED Notes (Signed)
Pt unable to urinate at this time.  
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
## 2018-09-06 ENCOUNTER — Encounter (HOSPITAL_COMMUNITY): Payer: Self-pay | Admitting: Emergency Medicine

## 2018-09-06 ENCOUNTER — Emergency Department (HOSPITAL_COMMUNITY): Payer: Medicaid Other

## 2018-09-06 LAB — URINALYSIS, ROUTINE W REFLEX MICROSCOPIC
BILIRUBIN URINE: NEGATIVE
Glucose, UA: NEGATIVE mg/dL
KETONES UR: 20 mg/dL — AB
LEUKOCYTES UA: NEGATIVE
Nitrite: NEGATIVE
Protein, ur: NEGATIVE mg/dL
Specific Gravity, Urine: 1.024 (ref 1.005–1.030)
pH: 6 (ref 5.0–8.0)

## 2018-09-06 LAB — POC URINE PREG, ED: PREG TEST UR: NEGATIVE

## 2018-09-06 MED ORDER — METHOCARBAMOL 500 MG PO TABS: 500.0000 mg | ORAL_TABLET | Freq: Two times a day (BID) | ORAL | 0 refills | Status: DC

## 2018-09-06 MED ORDER — LIDOCAINE 5 % EX PTCH: 1.0000 | MEDICATED_PATCH | CUTANEOUS | 0 refills | Status: DC

## 2018-09-06 MED ORDER — NAPROXEN 500 MG PO TABS: 500.0000 mg | ORAL_TABLET | Freq: Two times a day (BID) | ORAL | 0 refills | Status: DC

## 2018-09-06 MED ORDER — LIDOCAINE 5 % EX PTCH
2.0000 | MEDICATED_PATCH | CUTANEOUS | Status: DC
  Administered 2018-09-06: 2 via TRANSDERMAL
  Filled 2018-09-06: qty 2

## 2018-09-06 MED ORDER — NAPROXEN 250 MG PO TABS
500.0000 mg | ORAL_TABLET | ORAL | Status: AC
  Administered 2018-09-06: 500 mg via ORAL
  Filled 2018-09-06: qty 2

## 2018-09-06 MED ORDER — ACETAMINOPHEN 500 MG PO TABS
1000.0000 mg | ORAL_TABLET | Freq: Once | ORAL | Status: AC
  Administered 2018-09-06: 1000 mg via ORAL
  Filled 2018-09-06: qty 2

## 2018-09-06 NOTE — ED Notes (Signed)
RT called for IS training
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
## 2018-10-18 ENCOUNTER — Emergency Department (HOSPITAL_COMMUNITY): Payer: Medicaid Other

## 2018-10-18 ENCOUNTER — Other Ambulatory Visit: Payer: Self-pay

## 2018-10-18 ENCOUNTER — Encounter (HOSPITAL_COMMUNITY): Payer: Self-pay

## 2018-10-18 ENCOUNTER — Emergency Department (HOSPITAL_COMMUNITY)
Admission: EM | Admit: 2018-10-18 | Discharge: 2018-10-18 | Discharge status: Against Medical Advice | Payer: Medicaid Other | Attending: Emergency Medicine | Admitting: Emergency Medicine

## 2018-10-18 DIAGNOSIS — Z041 Encounter for examination and observation following transport accident: Secondary | ICD-10-CM | POA: Diagnosis present

## 2018-10-18 DIAGNOSIS — F17210 Nicotine dependence, cigarettes, uncomplicated: Secondary | ICD-10-CM | POA: Diagnosis not present

## 2018-10-18 DIAGNOSIS — Z79899 Other long term (current) drug therapy: Secondary | ICD-10-CM | POA: Diagnosis not present

## 2018-10-18 LAB — CBC WITH DIFFERENTIAL/PLATELET
Abs Immature Granulocytes: 0.01 10*3/uL (ref 0.00–0.07)
Basophils Absolute: 0 10*3/uL (ref 0.0–0.1)
Basophils Relative: 0 %
Eosinophils Absolute: 0 10*3/uL (ref 0.0–0.5)
Eosinophils Relative: 1 %
HCT: 41.5 % (ref 36.0–46.0)
Hemoglobin: 13.3 g/dL (ref 12.0–15.0)
Immature Granulocytes: 0 %
Lymphocytes Relative: 33 %
Lymphs Abs: 1.8 10*3/uL (ref 0.7–4.0)
MCH: 28.9 pg (ref 26.0–34.0)
MCHC: 32 g/dL (ref 30.0–36.0)
MCV: 90.2 fL (ref 80.0–100.0)
Monocytes Absolute: 0.3 10*3/uL (ref 0.1–1.0)
Monocytes Relative: 6 %
Neutro Abs: 3.2 10*3/uL (ref 1.7–7.7)
Neutrophils Relative %: 60 %
Platelets: 227 10*3/uL (ref 150–400)
RBC: 4.6 MIL/uL (ref 3.87–5.11)
RDW: 12.8 % (ref 11.5–15.5)
WBC: 5.4 10*3/uL (ref 4.0–10.5)
nRBC: 0 % (ref 0.0–0.2)

## 2018-10-18 LAB — BASIC METABOLIC PANEL WITH GFR
Anion gap: 7 (ref 5–15)
BUN: 7 mg/dL (ref 6–20)
CO2: 20 mmol/L — ABNORMAL LOW (ref 22–32)
Calcium: 8.5 mg/dL — ABNORMAL LOW (ref 8.9–10.3)
Chloride: 111 mmol/L (ref 98–111)
Creatinine, Ser: 0.63 mg/dL (ref 0.44–1.00)
GFR calc Af Amer: >60 mL/min
GFR calc non Af Amer: >60 mL/min
Glucose, Bld: 121 mg/dL — ABNORMAL HIGH (ref 70–99)
Potassium: 3.4 mmol/L — ABNORMAL LOW (ref 3.5–5.1)
Sodium: 138 mmol/L (ref 135–145)

## 2018-10-18 LAB — I-STAT BETA HCG BLOOD, ED (MC, WL, AP ONLY): I-stat hCG, quantitative: <5 m[IU]/mL

## 2018-10-18 NOTE — ED Notes (Signed)
Pt spoke with attending MD who informed pt and friend of all risks associated without completing medical treatment.  Pt understands risks and agreeable to sign AMA forms.  IV removed and pt ambulatory to exit without assistance.  All belongings taken with pt.

## 2018-10-18 NOTE — ED Notes (Signed)
Pt states she does not want to have any more treatment done and wants to leave.  Pt ambulatory without assistance.  Attending MD made aware, Kathie Rhodes Rancour MD to see pt.
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
## 2019-07-27 ENCOUNTER — Ambulatory Visit: Payer: Medicaid Other

## 2019-08-04 ENCOUNTER — Other Ambulatory Visit: Payer: Self-pay

## 2019-08-04 DIAGNOSIS — Z20822 Contact with and (suspected) exposure to covid-19: Secondary | ICD-10-CM

## 2019-08-05 LAB — NOVEL CORONAVIRUS, NAA: SARS-CoV-2, NAA: NOT DETECTED

## 2019-10-06 ENCOUNTER — Other Ambulatory Visit (HOSPITAL_COMMUNITY)
Admission: RE | Admit: 2019-10-06 | Discharge: 2019-10-06 | Disposition: A | Discharge status: Home / Self Care | Payer: Medicaid Other | Source: Ambulatory Visit | Attending: Internal Medicine | Admitting: Internal Medicine

## 2019-10-06 ENCOUNTER — Ambulatory Visit (INDEPENDENT_AMBULATORY_CARE_PROVIDER_SITE_OTHER): Payer: Medicaid Other | Admitting: Internal Medicine

## 2019-10-06 ENCOUNTER — Encounter: Payer: Self-pay | Admitting: Internal Medicine

## 2019-10-06 ENCOUNTER — Other Ambulatory Visit: Payer: Self-pay

## 2019-10-06 VITALS — BP 112/70 | HR 100 | Temp 98.7°F | Ht 62.5 in | Wt 165.0 lb

## 2019-10-06 DIAGNOSIS — N898 Other specified noninflammatory disorders of vagina: Secondary | ICD-10-CM | POA: Insufficient documentation

## 2019-10-06 DIAGNOSIS — G8929 Other chronic pain: Secondary | ICD-10-CM | POA: Diagnosis not present

## 2019-10-06 DIAGNOSIS — M545 Low back pain, unspecified: Secondary | ICD-10-CM

## 2019-10-06 DIAGNOSIS — E0590 Thyrotoxicosis, unspecified without thyrotoxic crisis or storm: Secondary | ICD-10-CM | POA: Insufficient documentation

## 2019-10-06 MED ORDER — IBUPROFEN 800 MG PO TABS: 800.0000 mg | ORAL_TABLET | Freq: Three times a day (TID) | ORAL | 1 refills | Status: DC | PRN

## 2019-10-06 NOTE — Assessment & Plan Note (Addendum)
[name] endorses a 2-week history of increased vaginal discharge with a malodorous smell that is described as fishy.  She also endorses dyspareunia and mild vaginal bleeding after intercourse.  She has had exposure to STDs in the past but no known exposure at this time.  She endorses unprotected sexual contact recently.  Assessment: Pelvic exam performed today and positive for vaginal discharge.  No cervical motion wall tenderness, decreasing the suspicion for PID.  Patient is in agreement to have Pap smear and chlamydia/gonorrhea testing.  She declines HIV and syphilis testing.  Plan: -G/C, wet prep pending -Pap smear pending

## 2019-10-06 NOTE — Progress Notes (Signed)
   CC: vaginal discharge  HPI:  Tammy May is a 39 y.o. with a PMHx of hyperthyroidism, BV, scoliosis who presents to the clinic for vaginal discharge.   Please see the Encounters tab for problem-based Assessment & Plan regarding status of patient's conditions.  Past Medical History:  Diagnosis Date  . Depression   . Scoliosis    Review of Systems: Review of Systems  Constitutional: Negative for chills and fever.  Gastrointestinal: Negative for abdominal pain, nausea and vomiting.  Genitourinary: Negative for flank pain.       Endorses vaginal discharge with odor, dyspareunia, vaginal bleeding after intercourse  Musculoskeletal: Positive for back pain. Negative for falls.   Physical Exam:  Vitals:   10/06/19 1556  BP: 112/70  Pulse: 100  Temp: 98.7 F (37.1 C)  TempSrc: Oral  SpO2: 98%  Weight: 165 lb (74.8 kg)  Height: 5' 2.5" (1.588 m)   Physical Exam Vitals and nursing note reviewed. Exam conducted with a chaperone present.  Constitutional:      Appearance: She is normal weight.  Pulmonary:     Effort: Pulmonary effort is normal. No respiratory distress.  Genitourinary:    General: Normal vulva.     Labia:        Right: No rash, tenderness, lesion or injury.        Left: No rash, tenderness, lesion or injury.      Vagina: Vaginal discharge and erythema present. No tenderness or bleeding.     Cervix: No friability or erythema.     Adnexa:        Right: No tenderness.         Left: No tenderness.       Comments: Cervix located on left lateral vaginal wall with os facing right. Musculoskeletal:     Thoracic back: Normal.     Lumbar back: No swelling, edema, deformity, spasms, tenderness or bony tenderness. Normal range of motion.  Skin:    General: Skin is warm and dry.  Neurological:     General: No focal deficit present.     Mental Status: She is alert and oriented to person, place, and time. Mental status is at baseline.     Gait: Gait normal.    Psychiatric:        Mood and Affect: Mood normal.        Behavior: Behavior normal.    Assessment & Plan:   See Encounters Tab for problem based charting.  Patient seen with Dr. Rogelia Boga

## 2019-10-06 NOTE — Patient Instructions (Signed)
It was nice seeing you today! Thank you for choosing [facility] for your Primary Care.    Today we talked about:   1. Vaginal discharge: I will call you the results!  2. Back Pain: This is likely muscular. I sent in a prescription for Ibuprofen 800 mg. You can take this every 8 hours as needed. I recommend to use it sparingly, as it can cause stomach upset.   3. Thyroid: We are checking your thyroid levels today since you have a history of hyperthyroidism.

## 2019-10-06 NOTE — Assessment & Plan Note (Signed)
[name] endorses a 66-month history of lower back pain that is in the same place every time.  Pain is worse at night due to sleeping in particular positions that exacerbate pain.  Alleviating factors are having her kids walk on her back and having massages.  Robaxin and NSAIDs have alleviated pain in the past as well.  She denies any paresthesias, saddle anesthesia, urinary or bowel incontinence, trauma or falls.  No decreased range of motion.  Assessment: I was unable to reproduce pain on examination today but patient has a full range of motion without difficulty.  No muscle spasms on examination, however description of back pain most consistent with muscular source.  Recommending NSAIDs at this time for both analgesic and anti-inflammatory factors.  Emphasized to use with caution as possibly can cause gastric upset.  Recommended she make an appointment for further evaluation should symptoms not improve.  Plan: -Ibuprofen 800 mg 3 times daily as needed

## 2019-10-06 NOTE — Assessment & Plan Note (Signed)
Tammy May endorses unintentional weight loss, heat intolerance but denies palpitations.  She remembers a distant history of having an abnormal thyroid lab results but states she was told to only take medication for 1 week.  No other follow-up since 2017.  Assessment: Per chart review, patient has a diagnosis of possible hyperthyroidism from 2017 with a TSH of 0.327 and T4 of 1.16.  She was referred to endocrinology at this time, who repeated labs with similar results.  Thyrotropin receptor antibodies were negative.  She endorses some symptoms consistent with hyperthyroidism.  Mildly tachycardic on vital signs at 100.  No proptosis on examination.  We will recheck TSH and free T4 today to ensure no advancement of lab abnormalities.  Plan: -T4 and TSH pending

## 2019-10-07 ENCOUNTER — Ambulatory Visit (HOSPITAL_COMMUNITY)
Admission: EM | Admit: 2019-10-07 | Discharge: 2019-10-07 | Disposition: A | Discharge status: Home / Self Care | Payer: Medicaid Other | Attending: Family Medicine | Admitting: Family Medicine

## 2019-10-07 ENCOUNTER — Other Ambulatory Visit: Payer: Self-pay

## 2019-10-07 ENCOUNTER — Encounter (HOSPITAL_COMMUNITY): Payer: Self-pay | Admitting: Emergency Medicine

## 2019-10-07 DIAGNOSIS — S61412A Laceration without foreign body of left hand, initial encounter: Secondary | ICD-10-CM | POA: Diagnosis not present

## 2019-10-07 DIAGNOSIS — W260XXA Contact with knife, initial encounter: Secondary | ICD-10-CM

## 2019-10-07 LAB — CERVICOVAGINAL ANCILLARY ONLY
Bacterial Vaginitis (gardnerella): POSITIVE — AB
Candida Glabrata: NEGATIVE
Candida Vaginitis: NEGATIVE
Chlamydia: NEGATIVE
Comment: NEGATIVE
Comment: NEGATIVE
Comment: NEGATIVE
Comment: NEGATIVE
Comment: NEGATIVE
Comment: NORMAL
Neisseria Gonorrhea: NEGATIVE
Trichomonas: POSITIVE — AB

## 2019-10-07 LAB — TSH: TSH: 0.469 u[IU]/mL (ref 0.450–4.500)

## 2019-10-07 LAB — T4, FREE: Free T4: 1.13 ng/dL (ref 0.82–1.77)

## 2019-10-07 MED ORDER — LIDOCAINE-EPINEPHRINE-TETRACAINE (LET) TOPICAL GEL
TOPICAL | Status: AC
  Filled 2019-10-07: qty 3

## 2019-10-07 NOTE — ED Triage Notes (Signed)
Pt states around 10pm last night she sliced the top of her L hand open with a knife while cutting potatoes. No bleeding at this time.

## 2019-10-07 NOTE — ED Notes (Signed)
LET applied to wound, okay per Cam Hai

## 2019-10-07 NOTE — ED Provider Notes (Signed)
[facility]    CSN: [id number] Arrival date & time: 10/07/19  5956      History   Chief Complaint Chief Complaint  Patient presents with  . Laceration    HPI Tammy May is a 39 y.o. female.   Pt is a 39 year old female that presents today with laceration to the dorsum of left hand. This occurred around 10 PM last night. Reporting she was using a new knife and cutting a potato when she accidentally cut her hand. The bleeding has been controlled. She is concerned that there needs to be some closure for the wound. No bleeding at this time. She reports she is up-to-date on her tetanus shot. Good range of motion of the hand, wrist and fingers.  ROS per HPI    Laceration   Past Medical History:  Diagnosis Date  . Depression   . Scoliosis     Patient Active Problem List   Diagnosis Date Noted  . Vaginal discharge 10/06/2019  . Hyperthyroidism 10/06/2019  . Lower back pain 10/06/2019  . 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 [place] finger, sequela 06/17/2016    Past Surgical History:  Procedure Laterality Date  . TUBAL LIGATION      OB History   No obstetric history on file.      Home Medications    Prior to Admission medications   Medication Sig Start Date End Date Taking? Authorizing Provider  albuterol (PROVENTIL HFA;VENTOLIN HFA) 108 (90 Base) MCG/ACT inhaler Inhale 2 puffs into the lungs every 4 (four) hours as needed for wheezing or shortness of breath. 04/01/17   Pollina, Gwenyth Allegra, MD  ibuprofen (ADVIL) 800 MG tablet Take 1 tablet (800 mg total) by mouth every 8 (eight) hours as needed. 10/06/19   Jose Persia, MD  lidocaine (LIDODERM) 5 % Place 1 patch onto the skin daily. Remove & Discard patch within 12 hours or as directed by MD 09/06/18   Randal Buba, April, MD    Family History Family History  Problem Relation Age of Onset  . Cancer Father   . Stroke Maternal Grandfather     Social History Social History   Tobacco Use  . Smoking status:  Current Every Day Smoker    Packs/day: 0.50    Types: Cigarettes  . Smokeless tobacco: Never Used  . Tobacco comment: Cutting Back  Substance Use Topics  . Alcohol use: Yes    Comment: occasionally  . Drug use: Yes    Types: Marijuana    Comment: Daily, Cutting Back     Allergies   Patient has no known allergies.   Review of Systems Review of Systems   Physical Exam Triage Vital Signs ED Triage Vitals  Enc Vitals Group     BP 10/07/19 0858 115/72     Pulse Rate 10/07/19 0858 88     Resp 10/07/19 0858 18     Temp 10/07/19 0858 98.8 F (37.1 C)     Temp src --      SpO2 10/07/19 0858 99 %     Weight --      Height --      Head Circumference --      Peak Flow --      Pain Score 10/07/19 0859 5     Pain Loc --      Pain Edu? --      Excl. in [place]? --    No data found.  Updated Vital Signs BP 115/72 (BP Location: Right Arm)  Pulse 88   Temp 98.8 F (37.1 C)   Resp 18   LMP 09/17/2019   SpO2 99%   Visual Acuity Right Eye Distance:   Left Eye Distance:   Bilateral Distance:    Right Eye Near:   Left Eye Near:    Bilateral Near:     Physical Exam Vitals and nursing note reviewed.  Constitutional:      General: She is not in acute distress.    Appearance: Normal appearance. She is not ill-appearing, toxic-appearing or diaphoretic.  HENT:     Head: Normocephalic.     Nose: Nose normal.  Eyes:     Conjunctiva/sclera: Conjunctivae normal.  Pulmonary:     Effort: Pulmonary effort is normal.  Musculoskeletal:        General: Normal range of motion.     Cervical back: Normal range of motion.  Skin:    General: Skin is warm and dry.     Findings: No rash.     Comments: 2 cm  superficial laceration to dorsum of left hand near base of 5th metacarpal.   Neurological:     Mental Status: She is alert.  Psychiatric:        Mood and Affect: Mood normal.      UC Treatments / Results  Labs (all labs ordered are listed, but only abnormal results are  displayed) Labs Reviewed - No data to display  EKG   Radiology No results found.  Procedures Laceration Repair  Date/Time: 10/07/2019 3:15 PM Performed by: Janace Aris, NP Authorized by: Janace Aris, NP   Consent:    Consent obtained:  Verbal   Consent given by:  Patient   Risks discussed:  Infection, pain and need for additional repair Laceration details:    Location:  Hand   Hand location:  L hand, dorsum   Length (cm):  2 Repair type:    Repair type:  Simple Exploration:    Contaminated: no   Treatment:    Area cleansed with:  Saline   Amount of cleaning:  Standard   Visualized foreign bodies/material removed: no   Skin repair:    Repair method:  Tissue adhesive Approximation:    Approximation:  Close Post-procedure details:    Dressing:  Open (no dressing)   (including critical care time)  Medications Ordered in UC Medications - No data to display  Initial Impression / Assessment and Plan / UC Course  I have reviewed the triage vital signs and the nursing notes.  Pertinent labs & imaging results that were available during my care of the patient were reviewed by me and considered in my medical decision making (see chart for details).     Laceration of left hand-cleaned well here in clinic and closed with Dermabond.  Tolerated well Instructions given on how to take care of the wound Follow up as needed for continued or worsening symptoms  Final Clinical Impressions(s) / UC Diagnoses   Final diagnoses:  Laceration of left hand, foreign body presence unspecified, initial encounter     Discharge Instructions     Do not get the glue wet for 24 hours Do not submerge in water.  The glue will fall off when it is ready  Follow up as needed for continued or worsening symptoms     ED Prescriptions    None     PDMP not reviewed this encounter.   Dahlia Byes A, NP 10/07/19 1516

## 2019-10-07 NOTE — Discharge Instructions (Addendum)
Do not get the glue wet for 24 hours Do not submerge in water.  The glue will fall off when it is ready  Follow up as needed for continued or worsening symptoms

## 2019-10-08 ENCOUNTER — Other Ambulatory Visit: Payer: Self-pay | Admitting: Internal Medicine

## 2019-10-08 ENCOUNTER — Encounter: Payer: Self-pay | Admitting: Internal Medicine

## 2019-10-08 MED ORDER — METRONIDAZOLE 500 MG PO TABS: 500.0000 mg | ORAL_TABLET | Freq: Two times a day (BID) | ORAL | 0 refills | Status: AC

## 2019-10-08 NOTE — Telephone Encounter (Signed)
I rec that she talk to the UC NP who eval the wound yesterday. There is no pic just description of 2cm superficial lac. If it is not bleeding, she might get away with just bandaging it but I cannot say for certain as she went to [facility]

## 2019-10-11 LAB — CYTOLOGY - PAP
Comment: NEGATIVE
Diagnosis: NEGATIVE
High risk HPV: POSITIVE — AB

## 2019-10-11 NOTE — Progress Notes (Signed)
Internal Medicine Clinic Attending  Case discussed with Dr. Basaraba at the time of the visit.  We reviewed the resident's history and exam and pertinent patient test results.  I agree with the assessment, diagnosis, and plan of care documented in the resident's note.    

## 2019-10-12 ENCOUNTER — Encounter: Payer: Self-pay | Admitting: Internal Medicine

## 2019-10-13 ENCOUNTER — Other Ambulatory Visit: Payer: Self-pay | Admitting: Internal Medicine

## 2019-10-13 DIAGNOSIS — R87810 Cervical high risk human papillomavirus (HPV) DNA test positive: Secondary | ICD-10-CM

## 2019-11-25 ENCOUNTER — Ambulatory Visit (INDEPENDENT_AMBULATORY_CARE_PROVIDER_SITE_OTHER): Payer: Medicaid Other | Admitting: Obstetrics and Gynecology

## 2019-11-25 ENCOUNTER — Other Ambulatory Visit: Payer: Self-pay

## 2019-11-25 ENCOUNTER — Encounter: Payer: Self-pay | Admitting: Obstetrics and Gynecology

## 2019-11-25 DIAGNOSIS — B977 Papillomavirus as the cause of diseases classified elsewhere: Secondary | ICD-10-CM | POA: Insufficient documentation

## 2019-11-25 NOTE — Patient Instructions (Signed)
Human Papillomavirus Human papillomavirus (HPV) is the most common sexually transmitted infection (STI). It easily spreads from person to person (is very contagious). There are many types of HPV. HPV often does not cause symptoms. Sometimes it may cause genital warts that can be seen and felt. There may also be wart-like lesions in the throat. You can have HPV for a long time and not know it. You may spread HPV to others without knowing it. Certain types of HPV may cause cancer. What are the causes? HPV is caused by a virus that spreads from person to person through sex. What increases the risk? You may be more likely to get HPV if you have or have had:  Unprotected sex of any kind.  Several sex partners.  A sex partner who has other sex partners.  Another STI.  A weak disease-fighting system (immune system).  Damaged skin in the genital, oral, or anal area. What are the signs or symptoms? Most people who have HPV do not have any symptoms. If you have symptoms, they may include:  Wart-like lesions in the throat from having oral sex.  Warts on the infected areas.  Genital warts that may itch, burn, bleed, or be painful during sex. How is this treated? There is no treatment for the virus itself. However, there are treatments for the health problems and symptoms HPV can cause. Your doctor may treat HPV by:  Giving medicines that are creams, lotions, liquids, or gels. These medicines may be injected into or put onto genital or anal warts.  Applying one of the following to the genital or anal warts: ? Extreme cold. ? An intense beam of light (laser treatment). ? Extreme heat.  Doing surgery to remove the genital or anal warts. Your doctor will monitor you closely after you are treated. HPV can come back and you may need treatment again. Follow these instructions at home: Medicines  Take over-the-counter and prescription medicines only as told by your doctor.  Do not treat  genital warts with medicines that are meant for hand warts. General instructions  Do not touch or scratch the warts.  Do not have sex while you are being treated.  Do not douche or use tampons during treatment (for women).  Tell your sex partner about your infection. He or she may also need to be treated.  If you get pregnant, tell your doctor that you have HPV. Your doctor will monitor you during the pregnancy.  Keep all follow-up visits as told by your doctor. This is important. How is this prevented?  Talk with your doctor about getting an HPV vaccine. This can prevent some HPV infections and related cancers. You may need 2-3 doses of the vaccine, depending on your age. ? The vaccine will not work if you already have HPV. ? The vaccine is not recommended for pregnant women.  After treatment, use condoms during sex. This helps to prevent future infections.  Have only one sex partner.  Have a sex partner who does not have other sex partners.  Get Pap tests as told by your doctor. Contact a doctor if:  The treated skin gets red, swollen, or painful.  You have a fever.  You feel ill.  You feel lumps or pimples in and around your genital or anal area.  You have bleeding from the vagina.  You have bleeding from the area that was treated.  You have pain during sex. Summary  HPV is the most common STI. It easily spreads   from person to person.  HPV often does not cause any symptoms.  HPV vaccine can prevent some HPV infections and related cancers.  There is no treatment for the virus itself. However, there are treatments for the health problems and symptoms HPV can cause. This information is not intended to replace advice given to you by your health care provider. Make sure you discuss any questions you have with your health care provider. Document Revised: 11/12/2018 Document Reviewed: 03/19/2018 Elsevier Patient Education  2020 Elsevier Inc.  

## 2019-11-25 NOTE — Progress Notes (Signed)
Tammy May presents for eval of HPV noted on pap smear  HPV not typed Cytology normal No history of abnormal pap smears in the past FH negative for GYN CA  PE AF VSS Lungs clear Heart RRR Abd soft+ BS  A/P HPV + Pap smear results reviewed with pt. HPV discussed and relationship to pap smears reviewed. ASCCP guidelines reviewed with pt. Return in 1 yr for repeat pap smear with HPV co testing.

## 2020-02-03 DIAGNOSIS — Z419 Encounter for procedure for purposes other than remedying health state, unspecified: Secondary | ICD-10-CM | POA: Diagnosis not present

## 2020-02-05 ENCOUNTER — Encounter: Payer: Self-pay | Admitting: Internal Medicine

## 2020-02-05 ENCOUNTER — Other Ambulatory Visit: Payer: Self-pay

## 2020-02-05 ENCOUNTER — Emergency Department (HOSPITAL_COMMUNITY): Payer: Medicaid Other

## 2020-02-05 ENCOUNTER — Encounter (HOSPITAL_COMMUNITY): Payer: Self-pay | Admitting: Emergency Medicine

## 2020-02-05 ENCOUNTER — Emergency Department (HOSPITAL_COMMUNITY)
Admission: EM | Admit: 2020-02-05 | Discharge: 2020-02-05 | Disposition: A | Discharge status: Home / Self Care | Payer: Medicaid Other | Attending: Emergency Medicine | Admitting: Emergency Medicine

## 2020-02-05 DIAGNOSIS — Z5321 Procedure and treatment not carried out due to patient leaving prior to being seen by health care provider: Secondary | ICD-10-CM | POA: Diagnosis not present

## 2020-02-05 DIAGNOSIS — R05 Cough: Secondary | ICD-10-CM | POA: Diagnosis not present

## 2020-02-05 DIAGNOSIS — J029 Acute pharyngitis, unspecified: Secondary | ICD-10-CM | POA: Diagnosis not present

## 2020-02-05 DIAGNOSIS — R0602 Shortness of breath: Secondary | ICD-10-CM | POA: Insufficient documentation

## 2020-02-05 LAB — BASIC METABOLIC PANEL
Anion gap: 9 (ref 5–15)
BUN: 8 mg/dL (ref 6–20)
CO2: 19 mmol/L — ABNORMAL LOW (ref 22–32)
Calcium: 8.9 mg/dL (ref 8.9–10.3)
Chloride: 109 mmol/L (ref 98–111)
Creatinine, Ser: 0.65 mg/dL (ref 0.44–1.00)
GFR calc Af Amer: >60 mL/min (ref >60)
GFR calc non Af Amer: >60 mL/min (ref >60)
Glucose, Bld: 95 mg/dL (ref 70–99)
Potassium: 3.9 mmol/L (ref 3.5–5.1)
Sodium: 137 mmol/L (ref 135–145)

## 2020-02-05 LAB — TROPONIN I (HIGH SENSITIVITY)
Troponin I (High Sensitivity): 2 ng/L (ref <18)
Troponin I (High Sensitivity): 2 ng/L (ref <18)

## 2020-02-05 LAB — CBC
HCT: 42.1 % (ref 36.0–46.0)
Hemoglobin: 13.6 g/dL (ref 12.0–15.0)
MCH: 28.5 pg (ref 26.0–34.0)
MCHC: 32.3 g/dL (ref 30.0–36.0)
MCV: 88.1 fL (ref 80.0–100.0)
Platelets: 247 10*3/uL (ref 150–400)
RBC: 4.78 MIL/uL (ref 3.87–5.11)
RDW: 12.4 % (ref 11.5–15.5)
WBC: 6.4 10*3/uL (ref 4.0–10.5)
nRBC: 0 % (ref 0.0–0.2)

## 2020-02-05 LAB — I-STAT BETA HCG BLOOD, ED (MC, WL, AP ONLY): I-stat hCG, quantitative: <5 m[IU]/mL (ref <5)

## 2020-02-05 LAB — GROUP A STREP BY PCR: Group A Strep by PCR: NOT DETECTED

## 2020-02-05 MED ORDER — SODIUM CHLORIDE 0.9% FLUSH: 3.0000 mL | Freq: Once | INTRAVENOUS | Status: DC

## 2020-02-05 NOTE — ED Notes (Signed)
Pt called x3. No asnwer

## 2020-02-05 NOTE — ED Notes (Signed)
Pt concerned about blood in her sputum.  This tech observed scant dark blood in her sputum.

## 2020-02-05 NOTE — ED Triage Notes (Signed)
Patient with shortness of breath since waking up yesterday morning.  She is coughing and has a sore throat.  She denies fever.  Patient speaking with full sentences.  She denies any chest pain.  Patient with nausea and vomiting.

## 2020-02-06 ENCOUNTER — Other Ambulatory Visit: Payer: Self-pay

## 2020-02-06 ENCOUNTER — Ambulatory Visit (HOSPITAL_COMMUNITY)
Admission: EM | Admit: 2020-02-06 | Discharge: 2020-02-06 | Disposition: A | Discharge status: Home / Self Care | Payer: Medicaid Other | Attending: Family Medicine | Admitting: Family Medicine

## 2020-02-06 ENCOUNTER — Encounter (HOSPITAL_COMMUNITY): Payer: Self-pay

## 2020-02-06 DIAGNOSIS — F17210 Nicotine dependence, cigarettes, uncomplicated: Secondary | ICD-10-CM | POA: Diagnosis not present

## 2020-02-06 DIAGNOSIS — Z20822 Contact with and (suspected) exposure to covid-19: Secondary | ICD-10-CM | POA: Insufficient documentation

## 2020-02-06 DIAGNOSIS — M419 Scoliosis, unspecified: Secondary | ICD-10-CM | POA: Diagnosis not present

## 2020-02-06 DIAGNOSIS — Z791 Long term (current) use of non-steroidal anti-inflammatories (NSAID): Secondary | ICD-10-CM | POA: Insufficient documentation

## 2020-02-06 DIAGNOSIS — Z79899 Other long term (current) drug therapy: Secondary | ICD-10-CM | POA: Insufficient documentation

## 2020-02-06 DIAGNOSIS — J4521 Mild intermittent asthma with (acute) exacerbation: Secondary | ICD-10-CM | POA: Insufficient documentation

## 2020-02-06 DIAGNOSIS — Z7952 Long term (current) use of systemic steroids: Secondary | ICD-10-CM | POA: Insufficient documentation

## 2020-02-06 LAB — SARS CORONAVIRUS 2 (TAT 6-24 HRS): SARS Coronavirus 2: NEGATIVE

## 2020-02-06 MED ORDER — METHYLPREDNISOLONE SODIUM SUCC 125 MG IJ SOLR
INTRAMUSCULAR | Status: AC
  Filled 2020-02-06: qty 2

## 2020-02-06 MED ORDER — ALBUTEROL SULFATE HFA 108 (90 BASE) MCG/ACT IN AERS
INHALATION_SPRAY | RESPIRATORY_TRACT | Status: AC
  Filled 2020-02-06: qty 6.7

## 2020-02-06 MED ORDER — ALBUTEROL SULFATE (2.5 MG/3ML) 0.083% IN NEBU
2.5000 mg | INHALATION_SOLUTION | Freq: Four times a day (QID) | RESPIRATORY_TRACT | 0 refills | Status: DC | PRN
Start: 2020-02-06 — End: 2021-03-15

## 2020-02-06 MED ORDER — METHYLPREDNISOLONE SODIUM SUCC 125 MG IJ SOLR
80.0000 mg | Freq: Once | INTRAMUSCULAR | Status: AC
  Administered 2020-02-06: 80 mg via INTRAMUSCULAR

## 2020-02-06 MED ORDER — AEROCHAMBER PLUS FLO-VU MEDIUM MISC
1.0000 | Freq: Once | Status: AC
  Administered 2020-02-06: 1

## 2020-02-06 MED ORDER — ONDANSETRON HCL 4 MG PO TABS: 4.0000 mg | ORAL_TABLET | Freq: Four times a day (QID) | ORAL | 0 refills | Status: DC

## 2020-02-06 MED ORDER — PREDNISONE 20 MG PO TABS: 20.0000 mg | ORAL_TABLET | Freq: Two times a day (BID) | ORAL | 0 refills | Status: DC

## 2020-02-06 MED ORDER — AEROCHAMBER PLUS FLO-VU LARGE MISC
Status: AC
  Filled 2020-02-06: qty 1

## 2020-02-06 MED ORDER — ALBUTEROL SULFATE HFA 108 (90 BASE) MCG/ACT IN AERS
2.0000 | INHALATION_SPRAY | Freq: Once | RESPIRATORY_TRACT | Status: AC
  Administered 2020-02-06: 2 via RESPIRATORY_TRACT

## 2020-02-06 NOTE — ED Provider Notes (Signed)
[facility]    CSN: [id number] Arrival date & time: 02/06/20  1008      History   Chief Complaint Chief Complaint  Patient presents with  . Anorexia  . Shortness of Breath  . Cough  . Wheezing    HPI Tammy May is a 39 y.o. female.   HPI  Patient has a history of asthma For the last 3 days she has had a decreased appetite and increased shortness of breath She states that she cannot eat because she is breathing so hard She has tried her albuterol inhaler but it is a couple years old She actually drove her to her sister's house so she could use her nephews nebulizer last night in order to get to sleep No fever chills. No exposure to Covid She has not had Covid vaccinations.  This is recommended for her. She went to the emergency room last night for her shortness of breath but was unable to stay because the long wait Chest x-ray was performed and is normal   Past Medical History:  Diagnosis Date  . Depression   . Scoliosis     Patient Active Problem List   Diagnosis Date Noted  . 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 Hyperthyroidism 10/06/2019  . Lower back pain 10/06/2019  . [place] finger, sequela 06/17/2016    Past Surgical History:  Procedure Laterality Date  . TUBAL LIGATION      OB History    Gravida  6   Para      Term      Preterm      AB  1   Living  6     SAB  1   TAB      Ectopic      Multiple      Live Births  6            Home Medications    Prior to Admission medications   Medication Sig Start Date End Date Taking? Authorizing Provider  albuterol (PROVENTIL HFA;VENTOLIN HFA) 108 (90 Base) MCG/ACT inhaler Inhale 2 puffs into the lungs every 4 (four) hours as needed for wheezing or shortness of breath. Patient not taking: Reported on 11/25/2019 04/01/17   Gilda Crease, MD  albuterol (PROVENTIL) (2.5 MG/3ML) 0.083% nebulizer solution Take 3 mLs (2.5 mg total) by nebulization every 6 (six) hours as needed for  wheezing or shortness of breath. 02/06/20   Eustace Moore, MD  ibuprofen (ADVIL) 800 MG tablet Take 1 tablet (800 mg total) by mouth every 8 (eight) hours as needed. 10/06/19   Verdene Lennert, MD  ondansetron (ZOFRAN) 4 MG tablet Take 1-2 tablets (4-8 mg total) by mouth every 6 (six) hours. As needed nausea 02/06/20   Eustace Moore, MD  predniSONE (DELTASONE) 20 MG tablet Take 1 tablet (20 mg total) by mouth 2 (two) times daily with a meal. 02/06/20   Eustace Moore, MD    Family History Family History  Problem Relation Age of Onset  . Cancer Father   . Stroke Maternal Grandfather     Social History Social History   Tobacco Use  . Smoking status: Current Every Day Smoker    Packs/day: 0.50    Types: Cigarettes  . Smokeless tobacco: Never Used  . Tobacco comment: Cutting Back  Vaping Use  . Vaping Use: Some days  Substance Use Topics  . Alcohol use: Yes    Comment: occasionally  . Drug use: Yes  Types: Marijuana    Comment: Daily, Cutting Back     Allergies   Patient has no known allergies.   Review of Systems Review of Systems  See HPI Physical Exam Triage Vital Signs ED Triage Vitals  Enc Vitals Group     BP 02/06/20 1028 118/76     Pulse Rate 02/06/20 1028 90     Resp 02/06/20 1028 (!) 22     Temp 02/06/20 1028 98.3 F (36.8 C)     Temp src --      SpO2 02/06/20 1028 99 %     Weight --      Height --      Head Circumference --      Peak Flow --      Pain Score 02/06/20 1030 5     Pain Loc --      Pain Edu? --      Excl. in [place]? --    No data found.  Updated Vital Signs BP 118/76   Pulse 90   Temp 98.3 F (36.8 C)   Resp (!) 22   LMP 01/09/2020 (Within Days)   SpO2 99%      Physical Exam Constitutional:      General: She is in acute distress.     Appearance: She is well-developed.     Comments: Patient is frightened because of her shortness of breath, is acutely tearful as I first entered the room.  HENT:     Head: Normocephalic  and atraumatic.     Mouth/Throat:     Mouth: Mucous membranes are moist.  Eyes:     Conjunctiva/sclera: Conjunctivae normal.     Pupils: Pupils are equal, round, and reactive to light.  Neck:     Thyroid: No thyromegaly.  Cardiovascular:     Rate and Rhythm: Normal rate and regular rhythm.     Heart sounds: Normal heart sounds.  Pulmonary:     Effort: Pulmonary effort is normal. Tachypnea present. No respiratory distress.     Breath sounds: Examination of the right-upper field reveals wheezing. Examination of the left-upper field reveals wheezing. Examination of the right-middle field reveals wheezing. Examination of the left-middle field reveals wheezing. Examination of the right-lower field reveals wheezing. Examination of the left-lower field reveals wheezing. Wheezing present.     Comments: Initially patient has wheezing throughout both lungs with inspiration.  Mild tachypnea.  After 1 albuterol treatment, 2 puffs through spacer, the patient is calmer and respiratory rate normal.  She still has few scattered inspiratory wheeze Chest:     Chest wall: No deformity or tenderness.  Abdominal:     General: There is no distension.     Palpations: Abdomen is soft.  Musculoskeletal:        General: Normal range of motion.     Cervical back: Normal range of motion.  Lymphadenopathy:     Cervical: No cervical adenopathy.  Skin:    General: Skin is warm and dry.  Neurological:     Mental Status: She is alert.  Psychiatric:        Mood and Affect: Mood normal.        Behavior: Behavior normal.      UC Treatments / Results  Labs (all labs ordered are listed, but only abnormal results are displayed) Labs Reviewed  SARS CORONAVIRUS 2 (TAT 6-24 HRS)    EKG   Radiology DG Chest 2 View  Result Date: 02/05/2020 CLINICAL DATA:  Chest pain and shortness of  breath. EXAM: CHEST - 2 VIEW COMPARISON:  10/18/2018 FINDINGS: The cardiomediastinal contours are normal. Mild bronchial  thickening. Pulmonary vasculature is normal. No consolidation, pleural effusion, or pneumothorax. No acute osseous abnormalities are seen. IMPRESSION: Mild bronchial thickening may be related to smoking, bronchitis or asthma. Electronically Signed   By: Narda Rutherford M.D.   On: 02/05/2020 02:47    Procedures Procedures (including critical care time)  Medications Ordered in UC Medications  albuterol (VENTOLIN HFA) 108 (90 Base) MCG/ACT inhaler 2 puff (2 puffs Inhalation Given 02/06/20 1044)  AeroChamber Plus Flo-Vu Medium MISC 1 each (1 each Other Given 02/06/20 1056)  methylPREDNISolone sodium succinate (SOLU-MEDROL) 125 mg/2 mL injection 80 mg (80 mg Intramuscular Given 02/06/20 1108)    Initial Impression / Assessment and Plan / UC Course  I have reviewed the triage vital signs and the nursing notes.  Pertinent labs & imaging results that were available during my care of the patient were reviewed by me and considered in my medical decision making (see chart for details).      Follow-up with primary care provider Management of asthma discussed Final Clinical Impressions(s) / UC Diagnoses   Final diagnoses:  Mild intermittent asthma with exacerbation     Discharge Instructions     Use the nebulizer every 4-6 hours as needed Take prednisone 2 times a day for 5 days Drink plenty of water See your primary care doctor if not improving    ED Prescriptions    Medication Sig Dispense Auth. Provider   albuterol (PROVENTIL) (2.5 MG/3ML) 0.083% nebulizer solution Take 3 mLs (2.5 mg total) by nebulization every 6 (six) hours as needed for wheezing or shortness of breath. 75 mL Eustace Moore, MD   predniSONE (DELTASONE) 20 MG tablet Take 1 tablet (20 mg total) by mouth 2 (two) times daily with a meal. 10 tablet Eustace Moore, MD   ondansetron (ZOFRAN) 4 MG tablet Take 1-2 tablets (4-8 mg total) by mouth every 6 (six) hours. As needed nausea 12 tablet Eustace Moore, MD      PDMP not reviewed this encounter.   Eustace Moore, MD 02/06/20 1131

## 2020-02-06 NOTE — ED Notes (Signed)
I have provided the patient with a take-home nebulizer machine, provided education with teach back and faxed paperwork to [employer].

## 2020-02-06 NOTE — Discharge Instructions (Signed)
Use the nebulizer every 4-6 hours as needed Take prednisone 2 times a day for 5 days Drink plenty of water See your primary care doctor if not improving

## 2020-02-06 NOTE — ED Triage Notes (Signed)
C/o loss of appetite x3 days. Also endorses shortness of breath and wheezing.

## 2020-03-05 DIAGNOSIS — Z419 Encounter for procedure for purposes other than remedying health state, unspecified: Secondary | ICD-10-CM | POA: Diagnosis not present

## 2020-04-05 DIAGNOSIS — Z419 Encounter for procedure for purposes other than remedying health state, unspecified: Secondary | ICD-10-CM | POA: Diagnosis not present

## 2020-05-05 DIAGNOSIS — Z419 Encounter for procedure for purposes other than remedying health state, unspecified: Secondary | ICD-10-CM | POA: Diagnosis not present

## 2020-06-05 DIAGNOSIS — Z419 Encounter for procedure for purposes other than remedying health state, unspecified: Secondary | ICD-10-CM | POA: Diagnosis not present

## 2020-06-18 DIAGNOSIS — Z20822 Contact with and (suspected) exposure to covid-19: Secondary | ICD-10-CM | POA: Diagnosis not present

## 2020-06-23 ENCOUNTER — Emergency Department (HOSPITAL_COMMUNITY): Payer: Medicaid Other

## 2020-06-23 ENCOUNTER — Emergency Department (HOSPITAL_COMMUNITY)
Admission: EM | Admit: 2020-06-23 | Discharge: 2020-06-23 | Disposition: A | Discharge status: Home / Self Care | Payer: Medicaid Other | Attending: Emergency Medicine | Admitting: Emergency Medicine

## 2020-06-23 ENCOUNTER — Encounter (HOSPITAL_COMMUNITY): Payer: Self-pay

## 2020-06-23 ENCOUNTER — Other Ambulatory Visit: Payer: Self-pay

## 2020-06-23 DIAGNOSIS — R112 Nausea with vomiting, unspecified: Secondary | ICD-10-CM | POA: Diagnosis not present

## 2020-06-23 DIAGNOSIS — Z79899 Other long term (current) drug therapy: Secondary | ICD-10-CM | POA: Insufficient documentation

## 2020-06-23 DIAGNOSIS — R0602 Shortness of breath: Secondary | ICD-10-CM | POA: Diagnosis not present

## 2020-06-23 DIAGNOSIS — F17210 Nicotine dependence, cigarettes, uncomplicated: Secondary | ICD-10-CM | POA: Insufficient documentation

## 2020-06-23 DIAGNOSIS — R21 Rash and other nonspecific skin eruption: Secondary | ICD-10-CM | POA: Diagnosis not present

## 2020-06-23 DIAGNOSIS — U071 COVID-19: Secondary | ICD-10-CM

## 2020-06-23 DIAGNOSIS — Z743 Need for continuous supervision: Secondary | ICD-10-CM | POA: Diagnosis not present

## 2020-06-23 LAB — CBC WITH DIFFERENTIAL/PLATELET
Abs Immature Granulocytes: 0.01 10*3/uL (ref 0.00–0.07)
Basophils Absolute: 0 10*3/uL (ref 0.0–0.1)
Basophils Relative: 0 %
Eosinophils Absolute: 0 10*3/uL (ref 0.0–0.5)
Eosinophils Relative: 0 %
HCT: 39.6 % (ref 36.0–46.0)
Hemoglobin: 13.2 g/dL (ref 12.0–15.0)
Immature Granulocytes: 0 %
Lymphocytes Relative: 31 %
Lymphs Abs: 1.2 10*3/uL (ref 0.7–4.0)
MCH: 29.4 pg (ref 26.0–34.0)
MCHC: 33.3 g/dL (ref 30.0–36.0)
MCV: 88.2 fL (ref 80.0–100.0)
Monocytes Absolute: 0.3 10*3/uL (ref 0.1–1.0)
Monocytes Relative: 7 %
Neutro Abs: 2.2 10*3/uL (ref 1.7–7.7)
Neutrophils Relative %: 62 %
Platelets: 130 10*3/uL — ABNORMAL LOW (ref 150–400)
RBC: 4.49 MIL/uL (ref 3.87–5.11)
RDW: 12.5 % (ref 11.5–15.5)
WBC: 3.7 10*3/uL — ABNORMAL LOW (ref 4.0–10.5)
nRBC: 0 % (ref 0.0–0.2)

## 2020-06-23 LAB — COMPREHENSIVE METABOLIC PANEL
ALT: 12 U/L (ref 0–44)
AST: 37 U/L (ref 15–41)
Albumin: 4.2 g/dL (ref 3.5–5.0)
Alkaline Phosphatase: 59 U/L (ref 38–126)
Anion gap: 12 (ref 5–15)
BUN: 8 mg/dL (ref 6–20)
CO2: 23 mmol/L (ref 22–32)
Calcium: 8.9 mg/dL (ref 8.9–10.3)
Chloride: 102 mmol/L (ref 98–111)
Creatinine, Ser: 0.87 mg/dL (ref 0.44–1.00)
GFR, Estimated: >60 mL/min (ref >60)
Glucose, Bld: 87 mg/dL (ref 70–99)
Potassium: 4.2 mmol/L (ref 3.5–5.1)
Sodium: 137 mmol/L (ref 135–145)
Total Bilirubin: 1.3 mg/dL — ABNORMAL HIGH (ref 0.3–1.2)
Total Protein: 7.5 g/dL (ref 6.5–8.1)

## 2020-06-23 LAB — LIPASE, BLOOD: Lipase: 23 U/L (ref 11–51)

## 2020-06-23 LAB — I-STAT BETA HCG BLOOD, ED (MC, WL, AP ONLY): I-stat hCG, quantitative: <5 m[IU]/mL (ref <5)

## 2020-06-23 MED ORDER — METOCLOPRAMIDE HCL 5 MG/ML IJ SOLN
5.0000 mg | Freq: Once | INTRAMUSCULAR | Status: AC
  Administered 2020-06-23: 5 mg via INTRAVENOUS
  Filled 2020-06-23: qty 2

## 2020-06-23 MED ORDER — SODIUM CHLORIDE 0.9 % IV BOLUS
500.0000 mL | Freq: Once | INTRAVENOUS | Status: AC
  Administered 2020-06-23: 500 mL via INTRAVENOUS

## 2020-06-23 MED ORDER — FENTANYL CITRATE (PF) 100 MCG/2ML IJ SOLN
50.0000 ug | Freq: Once | INTRAMUSCULAR | Status: AC
  Administered 2020-06-23: 50 ug via INTRAVENOUS
  Filled 2020-06-23: qty 2

## 2020-06-23 MED ORDER — ONDANSETRON 4 MG PO TBDP
4.0000 mg | ORAL_TABLET | Freq: Three times a day (TID) | ORAL | 0 refills | Status: DC | PRN
Start: 2020-06-23 — End: 2020-06-25

## 2020-06-23 MED ORDER — ONDANSETRON HCL 4 MG/2ML IJ SOLN
4.0000 mg | Freq: Once | INTRAMUSCULAR | Status: AC
  Administered 2020-06-23: 4 mg via INTRAVENOUS
  Filled 2020-06-23: qty 2

## 2020-06-23 NOTE — Discharge Instructions (Signed)
Please take nausea medicine as needed.  If your vomiting gets worse, you develop more abdominal pain or fever, difficulty in breathing or other new concerning symptom, return to ER for reassessment.  Strongly recommend recheck with your primary doctor.  If you do not have one, you can call the [facility] [place] clinic.

## 2020-06-23 NOTE — ED Triage Notes (Signed)
covid + on Monday. Vomiting x7 today.

## 2020-06-25 ENCOUNTER — Observation Stay (HOSPITAL_COMMUNITY)
Admission: EM | Admit: 2020-06-25 | Discharge: 2020-06-26 | Disposition: A | Discharge status: Home / Self Care | Payer: Medicaid Other | Attending: Internal Medicine | Admitting: Internal Medicine

## 2020-06-25 ENCOUNTER — Encounter (HOSPITAL_COMMUNITY): Payer: Self-pay | Admitting: Emergency Medicine

## 2020-06-25 ENCOUNTER — Emergency Department (HOSPITAL_COMMUNITY): Payer: Medicaid Other

## 2020-06-25 DIAGNOSIS — F329 Major depressive disorder, single episode, unspecified: Secondary | ICD-10-CM | POA: Insufficient documentation

## 2020-06-25 DIAGNOSIS — R197 Diarrhea, unspecified: Secondary | ICD-10-CM

## 2020-06-25 DIAGNOSIS — A0839 Other viral enteritis: Secondary | ICD-10-CM | POA: Diagnosis present

## 2020-06-25 DIAGNOSIS — F17210 Nicotine dependence, cigarettes, uncomplicated: Secondary | ICD-10-CM | POA: Diagnosis not present

## 2020-06-25 DIAGNOSIS — R112 Nausea with vomiting, unspecified: Secondary | ICD-10-CM | POA: Diagnosis present

## 2020-06-25 DIAGNOSIS — R109 Unspecified abdominal pain: Secondary | ICD-10-CM | POA: Diagnosis not present

## 2020-06-25 DIAGNOSIS — U071 COVID-19: Principal | ICD-10-CM | POA: Diagnosis present

## 2020-06-25 LAB — COMPREHENSIVE METABOLIC PANEL
ALT: 18 U/L (ref 0–44)
AST: 25 U/L (ref 15–41)
Albumin: 3.9 g/dL (ref 3.5–5.0)
Alkaline Phosphatase: 70 U/L (ref 38–126)
Anion gap: 14 (ref 5–15)
BUN: 6 mg/dL (ref 6–20)
CO2: 19 mmol/L — ABNORMAL LOW (ref 22–32)
Calcium: 9.3 mg/dL (ref 8.9–10.3)
Chloride: 104 mmol/L (ref 98–111)
Creatinine, Ser: 0.74 mg/dL (ref 0.44–1.00)
GFR, Estimated: >60 mL/min (ref >60)
Glucose, Bld: 107 mg/dL — ABNORMAL HIGH (ref 70–99)
Potassium: 3.5 mmol/L (ref 3.5–5.1)
Sodium: 137 mmol/L (ref 135–145)
Total Bilirubin: 0.9 mg/dL (ref 0.3–1.2)
Total Protein: 7.4 g/dL (ref 6.5–8.1)

## 2020-06-25 LAB — CBC
HCT: 47.4 % — ABNORMAL HIGH (ref 36.0–46.0)
Hemoglobin: 15.8 g/dL — ABNORMAL HIGH (ref 12.0–15.0)
MCH: 29 pg (ref 26.0–34.0)
MCHC: 33.3 g/dL (ref 30.0–36.0)
MCV: 87 fL (ref 80.0–100.0)
Platelets: 151 10*3/uL (ref 150–400)
RBC: 5.45 MIL/uL — ABNORMAL HIGH (ref 3.87–5.11)
RDW: 12.2 % (ref 11.5–15.5)
WBC: 3.7 10*3/uL — ABNORMAL LOW (ref 4.0–10.5)
nRBC: 0 % (ref 0.0–0.2)

## 2020-06-25 LAB — I-STAT BETA HCG BLOOD, ED (MC, WL, AP ONLY): I-stat hCG, quantitative: <5 m[IU]/mL (ref <5)

## 2020-06-25 LAB — RESPIRATORY PANEL BY RT PCR (FLU A&B, COVID)
Influenza A by PCR: NEGATIVE
Influenza B by PCR: NEGATIVE
SARS Coronavirus 2 by RT PCR: POSITIVE — AB

## 2020-06-25 LAB — LIPASE, BLOOD: Lipase: 24 U/L (ref 11–51)

## 2020-06-25 MED ORDER — SODIUM CHLORIDE 0.9 % IV BOLUS
1000.0000 mL | Freq: Once | INTRAVENOUS | Status: AC
  Administered 2020-06-25: 1000 mL via INTRAVENOUS

## 2020-06-25 MED ORDER — ONDANSETRON HCL 4 MG/2ML IJ SOLN
4.0000 mg | Freq: Once | INTRAMUSCULAR | Status: AC
  Administered 2020-06-25: 4 mg via INTRAVENOUS
  Filled 2020-06-25: qty 2

## 2020-06-25 MED ORDER — LACTATED RINGERS IV SOLN: INTRAVENOUS | Status: AC

## 2020-06-25 MED ORDER — ENOXAPARIN SODIUM 40 MG/0.4ML ~~LOC~~ SOLN
40.0000 mg | Freq: Every day | SUBCUTANEOUS | Status: DC
  Administered 2020-06-26: 40 mg via SUBCUTANEOUS
  Filled 2020-06-25: qty 0.4

## 2020-06-25 MED ORDER — ALBUTEROL SULFATE (2.5 MG/3ML) 0.083% IN NEBU: 2.5000 mg | INHALATION_SOLUTION | Freq: Four times a day (QID) | RESPIRATORY_TRACT | Status: DC | PRN

## 2020-06-25 MED ORDER — ADULT MULTIVITAMIN W/MINERALS CH
1.0000 | ORAL_TABLET | Freq: Every day | ORAL | Status: DC
  Administered 2020-06-26: 1 via ORAL
  Filled 2020-06-25: qty 1

## 2020-06-25 MED ORDER — NICOTINE 14 MG/24HR TD PT24
14.0000 mg | MEDICATED_PATCH | Freq: Every day | TRANSDERMAL | Status: DC
  Administered 2020-06-26 (×2): 14 mg via TRANSDERMAL
  Filled 2020-06-25 (×2): qty 1

## 2020-06-25 MED ORDER — ONDANSETRON 4 MG PO TBDP: 4.0000 mg | ORAL_TABLET | Freq: Three times a day (TID) | ORAL | 0 refills | Status: DC | PRN

## 2020-06-25 MED ORDER — IOHEXOL 300 MG/ML  SOLN
100.0000 mL | Freq: Once | INTRAMUSCULAR | Status: AC | PRN
  Administered 2020-06-25: 100 mL via INTRAVENOUS

## 2020-06-25 MED ORDER — ONDANSETRON HCL 4 MG/2ML IJ SOLN
4.0000 mg | Freq: Four times a day (QID) | INTRAMUSCULAR | Status: DC | PRN
  Administered 2020-06-26: 4 mg via INTRAVENOUS
  Filled 2020-06-25: qty 2

## 2020-06-25 MED ORDER — ONDANSETRON HCL 4 MG PO TABS
4.0000 mg | ORAL_TABLET | Freq: Four times a day (QID) | ORAL | Status: DC | PRN
  Administered 2020-06-26: 4 mg via ORAL
  Filled 2020-06-25: qty 1

## 2020-06-25 MED ORDER — ACETAMINOPHEN 325 MG PO TABS
650.0000 mg | ORAL_TABLET | Freq: Once | ORAL | Status: AC
  Administered 2020-06-25: 650 mg via ORAL
  Filled 2020-06-25: qty 2

## 2020-06-25 MED ORDER — MELATONIN 5 MG PO TABS
5.0000 mg | ORAL_TABLET | Freq: Every day | ORAL | Status: DC
  Administered 2020-06-26: 5 mg via ORAL
  Filled 2020-06-25 (×2): qty 1

## 2020-06-25 NOTE — Discharge Instructions (Addendum)
Your work-up today was overall reassuring. Your CT scan did show that you have a kidney stone in the right kidney however this is not causing any problems. Your CT scan also showed a pneumonia caused by COVID, and because this is due to a virus, we do not treat this with antibiotics as it is not caused by a bacteria.  Please take Tylenol or ibuprofen for pain. Please use the nausea medicine as needed for nausea. Please avoid using marijuana products as this may be contributing to your symptoms. I have reached out to the monoclonal antibody infusion clinic which provides a possible treatment for COVID-19. They will review your case and reach out to you if you are eligible. Please return to the ER for any new or worsening symptoms.

## 2020-06-25 NOTE — ED Notes (Signed)
Pt was seen at [facility] Friday night for all the same symptoms she has today  Had a c-t abd then all blood and urine ordered and  resulted

## 2020-06-25 NOTE — ED Notes (Signed)
Pt up to bedside restroom with x1 emesis episode, PA made aware. Medications ordered and administered, pt in bed provided fresh linens and in repositioned to most comfortable position.

## 2020-06-25 NOTE — ED Provider Notes (Addendum)
[facility] EMERGENCY DEPARTMENT Provider Note   CSN: [id number] Arrival date & time: 06/25/20  1541     History No chief complaint on file.   Tammy May is a 39 y.o. female.  HPI 39 year old female with a history of depression, scoliosis, hypothyroidism presents to the ER with complaints of nausea, vomiting and watery diarrhea for the last 7 days.  Patient states that she tested positive for Covid on 11/14, and has been having uncontrollable nausea and vomiting since.  She was seen at [facility] 2 days ago, states that she was given some fluids and some nausea medicine and felt better.  For some reason I cannot review the notes from this visit.  She states however after she went home, she continues to not be able to tolerate any liquids or fluids.  She states that she has been vomiting so much she is now having abdominal pain.  She states the pain is in the epigastric area.  She denies any dysuria or hematuria.  No vaginal discharge or bleeding.  She does admit to cannabis use, however states that she has not smoked any marijuana since she was diagnosed with Covid.Denies any cough or any other illicit drug use.    Past Medical History:  Diagnosis Date  . Depression   . Scoliosis     Patient Active Problem List   Diagnosis Date Noted  . 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 Hyperthyroidism 10/06/2019  . Lower back pain 10/06/2019  . [place] finger, sequela 06/17/2016    Past Surgical History:  Procedure Laterality Date  . TUBAL LIGATION       OB History    Gravida  6   Para      Term      Preterm      AB  1   Living  6     SAB  1   TAB      Ectopic      Multiple      Live Births  6           Family History  Problem Relation Age of Onset  . Cancer Father   . Stroke Maternal Grandfather     Social History   Tobacco Use  . Smoking status: Current Every Day Smoker    Packs/day: 0.50    Types: Cigarettes  . Smokeless tobacco:  Never Used  . Tobacco comment: Cutting Back  Vaping Use  . Vaping Use: Some days  Substance Use Topics  . Alcohol use: Yes    Comment: occasionally  . Drug use: Yes    Types: Marijuana    Comment: Daily, Cutting Back    Home Medications Prior to Admission medications   Medication Sig Start Date End Date Taking? Authorizing Provider  albuterol (PROVENTIL HFA;VENTOLIN HFA) 108 (90 Base) MCG/ACT inhaler Inhale 2 puffs into the lungs every 4 (four) hours as needed for wheezing or shortness of breath. 04/01/17  Yes Pollina, Canary Brim, MD  albuterol (PROVENTIL) (2.5 MG/3ML) 0.083% nebulizer solution Take 3 mLs (2.5 mg total) by nebulization every 6 (six) hours as needed for wheezing or shortness of breath. 02/06/20  Yes Eustace Moore, MD  FLUoxetine (PROZAC) 20 MG capsule Take 20 mg by mouth daily. 05/16/20  Yes [provider]  ondansetron (ZOFRAN ODT) 4 MG disintegrating tablet Take 1 tablet (4 mg total) by mouth every 8 (eight) hours as needed for nausea or vomiting. 06/25/20   Mare Ferrari, PA-C  Allergies    Patient has no known allergies.  Review of Systems   Review of Systems  Constitutional: Positive for chills. Negative for fever.  HENT: Negative for ear pain and sore throat.   Eyes: Negative for pain and visual disturbance.  Respiratory: Negative for cough and shortness of breath.   Cardiovascular: Negative for chest pain and palpitations.  Gastrointestinal: Positive for nausea and vomiting. Negative for abdominal pain.  Genitourinary: Negative for dysuria and hematuria.  Musculoskeletal: Negative for arthralgias and back pain.  Skin: Negative for color change and rash.  Neurological: Negative for seizures and syncope.  All other systems reviewed and are negative.   Physical Exam Updated Vital Signs BP (!) 102/92   Pulse 73   Temp 98.2 F (36.8 C)   Resp 16   SpO2 100%   Physical Exam Vitals and nursing note reviewed.  Constitutional:       General: She is not in acute distress.    Appearance: She is well-developed.  HENT:     Head: Normocephalic and atraumatic.  Eyes:     Conjunctiva/sclera: Conjunctivae normal.  Cardiovascular:     Rate and Rhythm: Normal rate and regular rhythm.     Heart sounds: No murmur heard.   Pulmonary:     Effort: Pulmonary effort is normal. No respiratory distress.     Breath sounds: Normal breath sounds.  Abdominal:     Palpations: Abdomen is soft.     Tenderness: There is abdominal tenderness in the right upper quadrant, right lower quadrant, epigastric area and left lower quadrant. There is right CVA tenderness. Negative signs include Murphy's sign.  Musculoskeletal:     Cervical back: Neck supple.  Skin:    General: Skin is warm and dry.  Neurological:     Mental Status: She is alert.     ED Results / Procedures / Treatments   Labs (all labs ordered are listed, but only abnormal results are displayed) Labs Reviewed  COMPREHENSIVE METABOLIC PANEL - Abnormal; Notable for the following components:      Result Value   CO2 19 (*)    Glucose, Bld 107 (*)    All other components within normal limits  CBC - Abnormal; Notable for the following components:   WBC 3.7 (*)    RBC 5.45 (*)    Hemoglobin 15.8 (*)    HCT 47.4 (*)    All other components within normal limits  RESPIRATORY PANEL BY RT PCR (FLU A&B, COVID)  LIPASE, BLOOD  URINALYSIS, ROUTINE W REFLEX MICROSCOPIC  I-STAT BETA HCG BLOOD, ED ([facility], [facility], [facility] ONLY)    EKG None  Radiology CT ABDOMEN PELVIS W CONTRAST  Result Date: 06/25/2020 CLINICAL DATA:  Abdominal pain EXAM: CT ABDOMEN AND PELVIS WITH CONTRAST TECHNIQUE: Multidetector CT imaging of the abdomen and pelvis was performed using the standard protocol following bolus administration of intravenous contrast. CONTRAST:  OMNIPAQUE IOHEXOL 300 MG/ML  SOLN COMPARISON:  None. FINDINGS: Lower chest: The visualized heart size within normal limits. No pericardial  fluid/thickening. No hiatal hernia. Multifocal patchy airspace opacities are seen within the right middle lobe and left lung base. There is also a small patchy airspace opacities in the posterior right lung base. Hepatobiliary: The liver is normal in density without focal abnormality.The main portal vein is patent. No evidence of calcified gallstones, gallbladder wall thickening or biliary dilatation. Pancreas: Unremarkable. No pancreatic ductal dilatation or surrounding inflammatory changes. Spleen: Normal in size without focal abnormality. Adrenals/Urinary Tract: Both adrenal  glands appear normal. Scattered calcifications seen in the upper pole of the right kidney the largest measuring 6 mm. No renal or collecting system calculi are noted. Bladder is unremarkable. Stomach/Bowel: The stomach, small bowel, and colon are normal in appearance. No inflammatory changes, wall thickening, or obstructive findings.The appendix is normal. Vascular/Lymphatic: There are no enlarged mesenteric, retroperitoneal, or pelvic lymph nodes. No significant vascular findings are present. Reproductive: The uterus and adnexa are unremarkable. Other: No evidence of abdominal wall mass or hernia. Musculoskeletal: No acute or significant osseous findings. IMPRESSION: Multifocal patchy airspace opacities at both lung bases, consistent with multifocal pneumonia. Nonobstructing right renal calculus. Electronically Signed   By: Jonna ClarkBindu  Avutu M.D.   On: 06/25/2020 19:31    Procedures Procedures (including critical care time)  Medications Ordered in ED Medications  sodium chloride 0.9 % bolus 1,000 mL (0 mLs Intravenous Stopped 06/25/20 2018)  ondansetron (ZOFRAN) injection 4 mg (4 mg Intravenous Given 06/25/20 1720)  acetaminophen (TYLENOL) tablet 650 mg (650 mg Oral Given 06/25/20 2015)  iohexol (OMNIPAQUE) 300 MG/ML solution 100 mL (100 mLs Intravenous Contrast Given 06/25/20 1903)  sodium chloride 0.9 % bolus 1,000 mL (1,000 mLs  Intravenous New Bag/Given 06/25/20 2136)  ondansetron (ZOFRAN) injection 4 mg (4 mg Intravenous Given 06/25/20 2136)    ED Course  I have reviewed the triage vital signs and the nursing notes.  Pertinent labs & imaging results that were available during my care of the patient were reviewed by me and considered in my medical decision making (see chart for details).    MDM Rules/Calculators/A&P                          39 year old female with nausea, vomiting, diarrhea in the setting of a positive Covid test on Sunday Presentation, she is alert, oriented, toxic appearing, no acute distress. Vitals overall reassuring. Physical exam with some epigastric and right upper quadrant discomfort. No lower abdominal tenderness on exam.  DDx includes appendicitis, gallstones, pancreatitis, nausea and vomiting in the setting of COVID-19, gastritis, cannabis hyperemesis syndrome  Labs personally reviewed and interpreted by me -CBC with mild leukopenia, hemoglobin 15.8, likely in the setting of COVID-19 -CMP without any electrolyte abnormalities, normal kidney and liver function tests -Pregnancy is negative  Given patient's epigastric tenderness, CT of the abdomen was ordered. This was positive for multifocal pneumonia likely in the setting of COVID-19. There was also a nonobstructing renal calculus on the right. Patient received fluids, Zofran, Tylenol. Will hold treatment for pneumonia as this is likely viral secondary to COVID 19.   On reevaluation, patient tolerated oral fluids well.  Lipase is normal.  Patient was educated on cannabis hyperemesis syndrome, instructed to avoid marijuana.  Will send home with additional Zofran.  Overall work-up reassuring.  No evidence of intra-abdominal process to explain her.  Suspect this is secondary to COVID.  [facility] infusion clinic messaged to see if she is eligible. Return precautions discussed. She was understanding and is agreeable.   As the patient was getting  ready to be discharged, she vomitted up the apple sauce and water. Expressed frustration over being seen here in the ED twice and having been sent home with no ability to hold down liquids of food. Would like to be admitted. Hospitalist team was consulted given failed PO challenge. They will admit for further management and treatment.   Case discussed w/ Dr. Fredderick PhenixBelfi who is agreeable to the above plan and disposition  Final Clinical Impression(s) / ED Diagnoses Final diagnoses:  Nausea vomiting and diarrhea  COVID-19    Rx / DC Orders ED Discharge Orders         Ordered    ondansetron (ZOFRAN ODT) 4 MG disintegrating tablet  Every 8 hours PRN        06/25/20 2107             Leone Brand 06/25/20 2158    Rolan Bucco, MD 06/26/20 [phone]

## 2020-06-25 NOTE — ED Notes (Signed)
Wants pain med for abd pain

## 2020-06-25 NOTE — ED Triage Notes (Signed)
Pt arrives via [facility] with chief complaint of +covid and symptoms are not improving. Pt states she tested + on 11/14 was seen at [facility] ED for N/V and here today because continues to have n/v as well as diarrhea. Unable to keep anything down.

## 2020-06-25 NOTE — H&P (Addendum)
Date: 06/25/2020               Patient Name:  Tammy May MRN: [id number]  DOB: 1981-06-27 Age / Sex: 39 y.o., female   PCP: Verdene Lennert, MD         Medical Service: Internal Medicine Teaching Service         Attending Physician: Dr. Charissa Bash    First Contact: Dr. Geralynn Rile Dagar Pager: [phone]  Second Contact: Dr. Eliezer Bottom Pager: [phone]       After Hours (After 5p/  First Contact Pager: [phone]  weekends / holidays): Second Contact Pager: [phone]   Chief Complaint: nausea, vomiting, diarrhea  History of Present Illness:   Tammy May is a 39 year old woman with history of depression, chronic bronchitis, 11-pack-year smoking history, daily marijuana use, unvaccinated against COVID who presents with a 4-day history of nausea, vomiting, and diarrhea after testing positive for COVID-19 one week ago on 11/14.  She states her 55 year old daughter tested positive for COVID so she got tested on 11/14 despite being asymptomatic. Reports her symptoms began this past Wednesday (11/17). States she has been unable to tolerate PO since then and on average has vomited about 6x and had 4 episodes of watery diarrhea daily. Denies bloody emesis or stool. Also endorses mild, crampy lower abdominal pain which is worse with vomiting. Endorses mild shortness of breath which is worse with exertion and a mild cough productive of light yellow sputum. Had a headache earlier this week but none now. States she has taken any medicine at home for her symptoms because she was unsure of the effect they would have.  Reports she went to the [facility] ED 2 days ago (11/19) where she was treated with IVF, IV fentanyl, Zofran, and Reglan. On chart review, labs were unremarkable except for WBC of 3.7. CXR was unremarkable. She was discharged home with return precautions.  Denies fevers/chills, lightheadedness, dizziness, blurry vision, chest pain, dysuria. Smokes marijuana and 1/2 pack of  cigarettes daily but has not since the onset of her symptoms.   ED Course: Afebrile, RR 15-22, P 80s-90s, BP 100s-110s/60s-70s, SpO2 >98% on RA. Lipase wnl (24), Upreg negative. COVID PCR+.CMP with Na 137, K 3.5, bicarb 19, BUN/Cr 6/0.74, AST/ALT/ALP 25/18/70. CBC with leukopenia (3.7), hemoglobin 15.8, Plts 151k. CT abdomen pelvis with multifocal patchy airspace opacities at both lung bases and nonobstructing R renal calculus. She was given 1 L NS bolus x 2, ondansetron, and acetaminophen with improvement in abdominal pain however failed a PO challenge.   Meds:  Current Meds  Medication Sig  . albuterol (PROVENTIL HFA;VENTOLIN HFA) 108 (90 Base) MCG/ACT inhaler Inhale 2 puffs into the lungs every 4 (four) hours as needed for wheezing or shortness of breath.  Marland Kitchen albuterol (PROVENTIL) (2.5 MG/3ML) 0.083% nebulizer solution Take 3 mLs (2.5 mg total) by nebulization every 6 (six) hours as needed for wheezing or shortness of breath.  Marland Kitchen FLUoxetine (PROZAC) 20 MG capsule Take 20 mg by mouth daily.    Social History: Lives with four of her six children. Also has 2 dogs and a bearded dragon. Has smoked 1/2 ppd since she was 39 yo (11.5 pack-year history). Daily marijuana smoker. No alcohol or cocaine use.  Family History: Father passed away from lung cancer 2 years ago. Grandma died from pneumonia.   Allergies: Allergies as of 06/25/2020  . (No Known Allergies)   Past Medical History:  Diagnosis Date  . Depression   . Scoliosis  Review of Systems: A complete ROS was negative except as per HPI.   Physical Exam: Blood pressure 102/68, pulse 81, temperature 98.2 F (36.8 C), resp. rate 15, SpO2 98 %. Constitutional: pleasant and well-appearing woman sitting upright in bed, in no acute distress HENT: normocephalic atraumatic, mucous membranes dry Eyes: conjunctiva non-erythematous Neck: supple Cardiovascular: regular rate and rhythm, no m/r/g Pulmonary/Chest: normal work of breathing on  room air, no dyspnea with speaking, SpO2 100% during conversation, faint crackles in bilateral lung bases, no wheezes Abdominal: soft, non-distended, mild tenderness to palpation in LLQ MSK: normal bulk and tone Neurological: alert & oriented x 3 Skin: warm and dry Psych: normal mood and affect  Labs: CBC    Component Value Date/Time   WBC 3.7 (L) 06/25/2020 1604   RBC 5.45 (H) 06/25/2020 1604   HGB 15.8 (H) 06/25/2020 1604   HCT 47.4 (H) 06/25/2020 1604   PLT 151 06/25/2020 1604   MCV 87.0 06/25/2020 1604   MCH 29.0 06/25/2020 1604   MCHC 33.3 06/25/2020 1604   RDW 12.2 06/25/2020 1604   LYMPHSABS 1.2 06/23/2020 1841   MONOABS 0.3 06/23/2020 1841   EOSABS 0.0 06/23/2020 1841   BASOSABS 0.0 06/23/2020 1841     CMP     Component Value Date/Time   NA 137 06/25/2020 1604   K 3.5 06/25/2020 1604   CL 104 06/25/2020 1604   CO2 19 (L) 06/25/2020 1604   GLUCOSE 107 (H) 06/25/2020 1604   BUN 6 06/25/2020 1604   CREATININE 0.74 06/25/2020 1604   CALCIUM 9.3 06/25/2020 1604   PROT 7.4 06/25/2020 1604   ALBUMIN 3.9 06/25/2020 1604   AST 25 06/25/2020 1604   ALT 18 06/25/2020 1604   ALKPHOS 70 06/25/2020 1604   BILITOT 0.9 06/25/2020 1604   GFRNONAA >60 06/25/2020 1604   GFRAA >60 02/05/2020 7846    Imaging: CT ABDOMEN PELVIS W CONTRAST  Result Date: 06/25/2020 CLINICAL DATA:  Abdominal pain EXAM: CT ABDOMEN AND PELVIS WITH CONTRAST TECHNIQUE: Multidetector CT imaging of the abdomen and pelvis was performed using the standard protocol following bolus administration of intravenous contrast. CONTRAST:  OMNIPAQUE IOHEXOL 300 MG/ML  SOLN COMPARISON:  None. FINDINGS: Lower chest: The visualized heart size within normal limits. No pericardial fluid/thickening. No hiatal hernia. Multifocal patchy airspace opacities are seen within the right middle lobe and left lung base. There is also a small patchy airspace opacities in the posterior right lung base. Hepatobiliary: The liver  is normal in density without focal abnormality.The main portal vein is patent. No evidence of calcified gallstones, gallbladder wall thickening or biliary dilatation. Pancreas: Unremarkable. No pancreatic ductal dilatation or surrounding inflammatory changes. Spleen: Normal in size without focal abnormality. Adrenals/Urinary Tract: Both adrenal glands appear normal. Scattered calcifications seen in the upper pole of the right kidney the largest measuring 6 mm. No renal or collecting system calculi are noted. Bladder is unremarkable. Stomach/Bowel: The stomach, small bowel, and colon are normal in appearance. No inflammatory changes, wall thickening, or obstructive findings.The appendix is normal. Vascular/Lymphatic: There are no enlarged mesenteric, retroperitoneal, or pelvic lymph nodes. No significant vascular findings are present. Reproductive: The uterus and adnexa are unremarkable. Other: No evidence of abdominal wall mass or hernia. Musculoskeletal: No acute or significant osseous findings. IMPRESSION: Multifocal patchy airspace opacities at both lung bases, consistent with multifocal pneumonia. Nonobstructing right renal calculus. Electronically Signed   By: Jonna Clark M.D.   On: 06/25/2020 19:31    EKG: none  Assessment &  Plan by Problem: Active Problems:   Diarrhea due to COVID-19   Tammy May is a 39 year old woman with history of depression, chronic bronchitis, 11-pack-year smoking history, daily marijuana use, unvaccinated against COVID who presents with a 4-day history of nausea, vomiting, and diarrhea after testing positive for COVID-19 one week ago on 11/14 and admitted due to PO intolerance.  Nausea, vomiting, diarrhea secondary to COVID-19 infection Chronic bronchitis Tested positive for COVID on 11/14. Unvaccinated. GI symptoms began 4 days ago on 11/17. Failed PO challenge in the ED. Endorses mild dyspnea and productive cough, however satting 100% on room air. Labs  unremarkable except for leukopenia (3.7). Will treat with antiemetics and advance diet as tolerated. No indication for remdesivir or steroids given no O2 requirement. Could consider monoclonal antibody infusion this admission. - Will consider monoclonal antibody infusion   - Zofran 4 mg q6h PRN - Clear liquid diet, advance as tolerated - Albuterol nebulizer q6h PRN - mIVF - IS, flutter valve - Ferritin, D-dimer, CRP - AM BMP, Mg  Depression On fluoxetine 20 mg daily. Has not been able to take for the last 4 days. - Continue home fluoxetine 20 mg daily as soon as tolerated  Tobacco use 11-pack-year smoking history. Reports has not smoked for the last 4 days. - Nicotine patch 14 mg - Smoking cessation counseling  Diet: Full liquid, progress as tolerated VTE: Enoxaparin IVF: LR,100cc/hr Code: Full  Prior to Admission Living Arrangement: Home Anticipated Discharge Location: Home Barriers to Discharge: PO intolerance  Dispo: Admit patient to Inpatient with expected length of stay greater than 2 midnights.  Signed: Alphonzo Severance, MD PGY-1 Internal Medicine Teaching Service Pager: [phone] 06/25/2020

## 2020-06-26 ENCOUNTER — Telehealth (HOSPITAL_COMMUNITY): Payer: Self-pay | Admitting: Nurse Practitioner

## 2020-06-26 DIAGNOSIS — R112 Nausea with vomiting, unspecified: Secondary | ICD-10-CM | POA: Diagnosis present

## 2020-06-26 DIAGNOSIS — U071 COVID-19: Secondary | ICD-10-CM

## 2020-06-26 LAB — CBC
HCT: 39.3 % (ref 36.0–46.0)
Hemoglobin: 12.6 g/dL (ref 12.0–15.0)
MCH: 28.8 pg (ref 26.0–34.0)
MCHC: 32.1 g/dL (ref 30.0–36.0)
MCV: 89.9 fL (ref 80.0–100.0)
Platelets: 119 10*3/uL — ABNORMAL LOW (ref 150–400)
RBC: 4.37 MIL/uL (ref 3.87–5.11)
RDW: 12.2 % (ref 11.5–15.5)
WBC: 2.6 10*3/uL — ABNORMAL LOW (ref 4.0–10.5)
nRBC: 0 % (ref 0.0–0.2)

## 2020-06-26 LAB — BASIC METABOLIC PANEL
Anion gap: 14 (ref 5–15)
BUN: 5 mg/dL — ABNORMAL LOW (ref 6–20)
CO2: 18 mmol/L — ABNORMAL LOW (ref 22–32)
Calcium: 7.9 mg/dL — ABNORMAL LOW (ref 8.9–10.3)
Chloride: 108 mmol/L (ref 98–111)
Creatinine, Ser: 0.57 mg/dL (ref 0.44–1.00)
GFR, Estimated: >60 mL/min (ref >60)
Glucose, Bld: 94 mg/dL (ref 70–99)
Potassium: 3 mmol/L — ABNORMAL LOW (ref 3.5–5.1)
Sodium: 140 mmol/L (ref 135–145)

## 2020-06-26 LAB — C-REACTIVE PROTEIN: CRP: 1.5 mg/dL — ABNORMAL HIGH (ref <1.0)

## 2020-06-26 LAB — FERRITIN: Ferritin: 64 ng/mL (ref 11–307)

## 2020-06-26 LAB — D-DIMER, QUANTITATIVE: D-Dimer, Quant: 0.42 ug/mL-FEU (ref 0.00–0.50)

## 2020-06-26 MED ORDER — MELATONIN 3 MG PO TABS
3.0000 mg | ORAL_TABLET | Freq: Once | ORAL | Status: AC
  Administered 2020-06-26: 3 mg via ORAL
  Filled 2020-06-26: qty 1

## 2020-06-26 MED ORDER — FLUOXETINE HCL 20 MG PO CAPS
20.0000 mg | ORAL_CAPSULE | Freq: Every day | ORAL | Status: DC
  Administered 2020-06-26: 20 mg via ORAL
  Filled 2020-06-26 (×2): qty 1

## 2020-06-26 MED ORDER — POTASSIUM CHLORIDE 20 MEQ PO PACK
40.0000 meq | PACK | Freq: Two times a day (BID) | ORAL | Status: DC
  Administered 2020-06-26: 40 meq via ORAL
  Filled 2020-06-26: qty 2

## 2020-06-26 NOTE — ED Notes (Signed)
Pt refused house tray. Ordered another tray

## 2020-06-26 NOTE — Progress Notes (Signed)
   Subjective: HD#1 Patient endorses she feels "okay". Last episode of emesis was around 2am and she denies any continued nausea at this time. She denies any abdominal pain or headache at this time. Encouraged oral intake today.  Discussion about discharge if able to tolerate oral and agrees with the plan.  Objective:  Vital signs in last 24 hours: Vitals:   06/26/20 0900 06/26/20 0915 06/26/20 0949 06/26/20 1100  BP: 99/68  99/68 116/68  Pulse: 77 70 80 79  Resp: 19 18 17 15   Temp:   98.2 F (36.8 C)   TempSrc:   Oral   SpO2: 96% 96% 100% 99%   Physical Exam: General: Well developed, well-appearing woman laying in bed, in no acute distress HENT: [place]/NT, MMM Cardiovascular: RRR, No murmurs,rubs, gallops. Pulmonary: Normal breath sounds, no dyspnea, no wheezes Abdominal: soft, non-distended, no tenderness Neurological: AAO*3 Skin: Warm and dry Psych: Normal mood and affect, normal judgement and normal thought content.  Assessment/Plan:  [name] is a 39 year old woman with history of depression, chronic bronchitis, 11-pack-year smoking history, daily marijuana use, unvaccinated against COVID who presents with a 4-day history of nausea, vomiting, and diarrhea after testing positive for COVID-19 one week ago on 11/14 and presented due to PO intolerance.  Active Problems:   Diarrhea due to COVID-19  Nausea, vomiting, diarrhea secondary to COVID-19 infection Chronic bronchitis Denies nausea this morning. Had last episode of vomiting at 2 am. Denies abdominal pain. In examination- no tenderness. Tolerated her SSRI with a sip of water this morning.Tested positive for COVID on 11/14. Unvaccinated. No cough. Her D-dimer is 0.42, Ferritin 64. Her potassium this morning is 3.0  - Will consider monoclonal antibody infusion - if she tolerates oral - Zofran 4 mg q6h PRN - Clear liquid diet, advance as tolerated - Albuterol nebulizer q6h PRN - Maintenance IVF - Kcl 40 meq  BID  Depression On fluoxetine 20 mg daily. Has not been able to take for the last 4 days. - Continue home fluoxetine 20 mg daily as soon as tolerated  Tobacco use 11-pack-year smoking history. Reports has not smoked for the last 4 days. - Nicotine patch 14 mg - Smoking cessation counseling  Prior to Admission Living Arrangement: Home Anticipated Discharge Location: Home Barriers to Discharge: If tolerates orally  Dispo: Anticipated discharge in approximately 0 day(s).   12/14, MD 06/26/2020, 11:27 AM Pager: [phone] After 5pm on weekdays and 1pm on weekends: On Call pager [phone]

## 2020-06-26 NOTE — ED Notes (Signed)
Breakfast tray ordered 

## 2020-06-26 NOTE — ED Notes (Signed)
Phone has been provided to the pt and number to call in her trays.

## 2020-06-26 NOTE — ED Notes (Signed)
Pt discharged ambulatory. All questions and concerns addressed. No complaints at this time.   

## 2020-06-26 NOTE — ED Notes (Signed)
Medicated pt according to [facility]. Pt tolerating food/ fluids. Updated provider.

## 2020-06-26 NOTE — ED Notes (Signed)
Patient upset with breakfast tray contents. Voiced frustrations on how she can't order her own breakfast and make choices. Attempted to explain to patient process of ED hold. Patient continued to yell. Informed patient will call and ask for another tray.

## 2020-06-26 NOTE — ED Notes (Signed)
Ordered breakfast 

## 2020-06-26 NOTE — Hospital Course (Addendum)
Nausea, vomiting, diarrhea secondary to COVID-19 infection Chronic bronchitis Tested positive for COVID on 11/14. Unvaccinated. GI symptoms began 4 days ago on 11/17. Failed PO challenge in the ED. Endorsed mild dyspnea and productive cough, however saturation 100% on room air. Labs unremarkable except for leukopenia (3.7). Treated with antiemetics ofran 4 mg q6h PRN and advanced diet as tolerated. Able to tolerate orally, no cough, no dyspnea, no nausea or vomiting. No indication for remdesivir or steroids given no O2 requirement. [facility] infusion lab is informed and they will call her to set up an appointment.  Depression On fluoxetine 20 mg daily. Has not been able to take for the last 4 days.Continue home fluoxetine 20 mg daily as soon as tolerated   Tobacco use 11-pack-year smoking history. Reports has not smoked for the last 4 days.Nicotine patch 14 mg & Smoking cessation counseling

## 2020-06-26 NOTE — Telephone Encounter (Signed)
Called to discuss with patient about Covid symptoms and the use of the monoclonal antibody infusion for those with mild to moderate Covid symptoms and at a high risk of hospitalization.     Pt states that she is not interested in receiving this therapy, at this time.

## 2020-06-27 NOTE — Discharge Summary (Signed)
Name: Tammy May MRN: [id number] DOB: [birth year] 39 y.o. PCP: Verdene Lennert, MD  Date of Admission: 06/25/2020  3:49 PM Date of Discharge: 06/26/2020 Attending Physician: Dr. Mayford Knife Discharge Diagnosis: 1. Nausea, vomiting, Diarrhea 2/2 COVID 19 infection 2. Depression 3. Tobacco use   Discharge Medications: Allergies as of 06/26/2020   No Known Allergies     Medication List    TAKE these medications   albuterol 108 (90 Base) MCG/ACT inhaler Commonly known as: VENTOLIN HFA Inhale 2 puffs into the lungs every 4 (four) hours as needed for wheezing or shortness of breath.   albuterol (2.5 MG/3ML) 0.083% nebulizer solution Commonly known as: PROVENTIL Take 3 mLs (2.5 mg total) by nebulization every 6 (six) hours as needed for wheezing or shortness of breath.   FLUoxetine 20 MG capsule Commonly known as: PROZAC Take 20 mg by mouth daily.   ondansetron 4 MG disintegrating tablet Commonly known as: Zofran ODT Take 1 tablet (4 mg total) by mouth every 8 (eight) hours as needed for nausea or vomiting.       Disposition and follow-up:   [name] was discharged from [facility] in Stable condition.  At the hospital follow up visit please address:  1.  -Nausea, vomiting, Diarrhea 2/2 COVID 19 infection : [facility] ( monoclonal antibody infusion  )infusion lab contacted and they will follow up to screen and set up outpatient appointment. - Depression: Continue fluoxetine 20 mg daily and see psychiatrist if needed. - Tobacco use: Smoking cessation counseling   2.  Labs / imaging needed at time of follow-up: None  3.  Pending labs/ test needing follow-up: None  Follow-up Appointments:  Follow-up Information    [facility] EMERGENCY DEPARTMENT.   Specialty: Emergency Medicine Why: If symptoms worsen Contact information: [address] [id number] mc [place] [address] [phone]               Hospital Course by problem list: Nausea, vomiting, diarrhea secondary to COVID-19 infection Chronic bronchitis Tested positive for COVID on 11/14. Unvaccinated. GI symptoms began 4 days ago on 11/17. Failed PO challenge in the ED. Endorsed mild dyspnea and productive cough, however saturation 100% on room air. Labs unremarkable except for leukopenia (3.7). Treated with antiemetics ofran 4 mg q6h PRN and advanced diet as tolerated. Able to tolerate orally, no cough, no dyspnea, no nausea or vomiting. No indication for remdesivir or steroids given no O2 requirement. Mab infusion lab is informed and they will call her to set up an appointment.  Depression On fluoxetine 20 mg daily. Has not been able to take for the last 4 days.Continue home fluoxetine 20 mg daily as soon as tolerated   Tobacco use 11-pack-year smoking history. Reports has not smoked for the last 4 days.Nicotine patch 14 mg & Smoking cessation counseling  Discharge Vitals:   BP 117/78   Pulse 89   Temp 98.2 F (36.8 C) (Oral)   Resp 18   SpO2 100%   Pertinent Labs, Studies, and Procedures:  CBC Latest Ref Rng & Units 06/26/2020 06/25/2020 06/23/2020  WBC 4.0 - 10.5 K/uL 2.6(L) 3.7(L) 3.7(L)  Hemoglobin 12.0 - 15.0 g/dL 24.4 15.8(H) 13.2  Hematocrit 36 - 46 % 39.3 47.4(H) 39.6  Platelets 150 - 400 K/uL 119(L) 151 130(L)   BMP Latest Ref Rng & Units 06/26/2020 06/25/2020 06/23/2020  Glucose 70 - 99 mg/dL 94 010(U) 87  BUN 6 - 20 mg/dL 5(L) 6 8  Creatinine 7.25 -  1.00 mg/dL 7.61 6.07 3.71  Sodium 135 - 145 mmol/L 140 137 137  Potassium 3.5 - 5.1 mmol/L 3.0(L) 3.5 4.2  Chloride 98 - 111 mmol/L 108 104 102  CO2 22 - 32 mmol/L 18(L) 19(L) 23  Calcium 8.9 - 10.3 mg/dL 7.9(L) 9.3 8.9   CT Abdomen  IMPRESSION: Multifocal patchy airspace opacities at both lung bases, consistent with multifocal pneumonia.  Nonobstructing right renal calculus.  Discharge Instructions: Discharge Instructions    Call MD for:   difficulty breathing, headache or visual disturbances   Complete by: As directed    Call MD for:  persistant dizziness or light-headedness   Complete by: As directed    Call MD for:  persistant nausea and vomiting   Complete by: As directed    Diet general   Complete by: As directed    Discharge instructions   Complete by: As directed    [name]: You presented with nausea, vomiting, diarrhea starting 5 days ago. You tested positive for COVID on 11/14. You also complained of mild dyspnea and productive cough. CT abdomen pelvis with multifocal patchy airspace opacities at both lung bases and nonobstructing R renal calculus.  You were given Intravenous fluids, ondansetron and acetaminophen with improvement in abdominal pain however failed a PO challenge. You stayed overnight here and received Zofran 4 mg as needed. In the morning you felt fine, no nausea, vomiting or abdominal pain. You were able to tolerate oral fluids and food.  Mab infusion team is contacted and they will call you, screen you and will set you up with appointment.   Increase activity slowly   Complete by: As directed       Signed: Arnoldo Lenis, MD 06/27/2020, 12:54 PM   Pager: [phone]

## 2020-07-04 NOTE — ED Provider Notes (Addendum)
[facility]-EMERGENCY DEPT Provider Note   CSN: [id number] Arrival date & time: 06/23/20  1650     History Chief Complaint  Patient presents with  . Covid Positive  . Emesis    Tammy May is a 39 y.o. female.  Presents to ER with concern for nausea and vomiting.  Reports tested positive for COVID-19 on Monday.  Undulating mild symptoms but throughout the day today has had progressive nausea and vomiting.  Nonbloody nonbilious.  No associated abdominal pain.  Mild cough but no difficulty in breathing..  No fevers today.  HPI     Past Medical History:  Diagnosis Date  . Depression   . Scoliosis     Patient Active Problem List   Diagnosis Date Noted  . Intractable nausea and vomiting 06/26/2020  . Diarrhea due to COVID-19 06/25/2020  . 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 Hyperthyroidism 10/06/2019  . Lower back pain 10/06/2019  . [place] finger, sequela 06/17/2016    Past Surgical History:  Procedure Laterality Date  . TUBAL LIGATION       OB History    Gravida  6   Para      Term      Preterm      AB  1   Living  6     SAB  1   TAB      Ectopic      Multiple      Live Births  6           Family History  Problem Relation Age of Onset  . Cancer Father   . Stroke Maternal Grandfather     Social History   Tobacco Use  . Smoking status: Current Every Day Smoker    Packs/day: 0.50    Types: Cigarettes  . Smokeless tobacco: Never Used  . Tobacco comment: Cutting Back  Vaping Use  . Vaping Use: Some days  Substance Use Topics  . Alcohol use: Yes    Comment: occasionally  . Drug use: Yes    Types: Marijuana    Comment: Daily, Cutting Back    Home Medications Prior to Admission medications   Medication Sig Start Date End Date Taking? Authorizing Provider  albuterol (PROVENTIL HFA;VENTOLIN HFA) 108 (90 Base) MCG/ACT inhaler Inhale 2 puffs into the lungs every 4 (four) hours as needed for wheezing or shortness of  breath. 04/01/17   Pollina, Canary Brim, MD  albuterol (PROVENTIL) (2.5 MG/3ML) 0.083% nebulizer solution Take 3 mLs (2.5 mg total) by nebulization every 6 (six) hours as needed for wheezing or shortness of breath. 02/06/20   Eustace Moore, MD  FLUoxetine (PROZAC) 20 MG capsule Take 20 mg by mouth daily. 05/16/20   [provider]  ondansetron (ZOFRAN ODT) 4 MG disintegrating tablet Take 1 tablet (4 mg total) by mouth every 8 (eight) hours as needed for nausea or vomiting. 06/25/20   Mare Ferrari, PA-C    Allergies    Patient has no known allergies.  Review of Systems   Review of Systems  Constitutional: Positive for chills and fatigue. Negative for fever.  HENT: Negative for ear pain and sore throat.   Eyes: Negative for pain and visual disturbance.  Respiratory: Positive for cough. Negative for shortness of breath.   Cardiovascular: Negative for chest pain and palpitations.  Gastrointestinal: Positive for nausea and vomiting. Negative for abdominal pain.  Genitourinary: Negative for dysuria and hematuria.  Musculoskeletal: Negative for arthralgias and back pain.  Skin: Negative for color change and rash.  Neurological: Negative for seizures and syncope.  All other systems reviewed and are negative.   Physical Exam Updated Vital Signs BP 101/72 (BP Location: Left Arm)   Pulse 87   Temp 98.2 F (36.8 C) (Oral)   Resp 17   Ht 5\' 2"  (1.575 m)   Wt 72 kg   SpO2 98%   BMI 29.03 kg/m   Physical Exam Vitals and nursing note reviewed.  Constitutional:      General: She is not in acute distress.    Appearance: She is well-developed.  HENT:     Head: Normocephalic and atraumatic.  Eyes:     Conjunctiva/sclera: Conjunctivae normal.  Cardiovascular:     Rate and Rhythm: Normal rate and regular rhythm.     Heart sounds: No murmur heard.   Pulmonary:     Effort: Pulmonary effort is normal. No respiratory distress.     Breath sounds: Normal breath sounds.    Abdominal:     Palpations: Abdomen is soft.     Tenderness: There is no abdominal tenderness.  Musculoskeletal:     Cervical back: Neck supple.  Skin:    General: Skin is warm and dry.  Neurological:     General: No focal deficit present.     Mental Status: She is alert and oriented to person, place, and time.  Psychiatric:        Mood and Affect: Mood normal.        Behavior: Behavior normal.     ED Results / Procedures / Treatments   Labs (all labs ordered are listed, but only abnormal results are displayed) Labs Reviewed  COMPREHENSIVE METABOLIC PANEL - Abnormal; Notable for the following components:      Result Value   Total Bilirubin 1.3 (*)    All other components within normal limits  CBC WITH DIFFERENTIAL/PLATELET - Abnormal; Notable for the following components:   WBC 3.7 (*)    Platelets 130 (*)    All other components within normal limits  LIPASE, BLOOD  CBC WITH DIFFERENTIAL/PLATELET  I-STAT BETA HCG BLOOD, ED ([facility], [facility], [facility] ONLY)    EKG None  Radiology No results found.  Procedures Procedures (including critical care time)  Medications Ordered in ED Medications  ondansetron (ZOFRAN) injection 4 mg (4 mg Intravenous Given 06/23/20 1745)  fentaNYL (SUBLIMAZE) injection 50 mcg (50 mcg Intravenous Given 06/23/20 1805)  sodium chloride 0.9 % bolus 500 mL (0 mLs Intravenous Stopped 06/23/20 1848)  metoCLOPramide (REGLAN) injection 5 mg (5 mg Intravenous Given 06/23/20 2032)    ED Course  I have reviewed the triage vital signs and the nursing notes.  Pertinent labs & imaging results that were available during my care of the patient were reviewed by me and considered in my medical decision making (see chart for details).  Clinical Course as of Jul 05 2351  Fri Jun 23, 2020  2029 Symptoms resolved, tolerating p.o., will discharge home   [RD]    Clinical Course User Index [RD] Jun 25, 2020, MD   MDM Rules/Calculators/A&P                          39 year old lady presents to ER with concern for nausea and vomiting in setting of known COVID-19.  On exam patient is noted to be well-appearing in no distress.  Her abdomen was soft and vitals were stable.  Low suspicion for acute abdominal process.  Suspect her symptoms are related to COVID-19.  Basic labs within normal limits, no significant dehydration.  Mild leukopenia consistent with COVID-19.  Provided symptomatic control with fluids, antiemetics.  Her symptoms resolved, tolerating p.o. without difficulty.  At present believe she is appropriate for discharge and outpatient management.  Reviewed return precautions in detail and patient demonstrated good understanding.  Recommended follow-up with a primary care office but pt does not have one - provided info for [place] [facility].    After the discussed management above, the patient was determined to be safe for discharge.  The patient was in agreement with this plan and all questions regarding their care were answered.  ED return precautions were discussed and the patient will return to the ED with any significant worsening of condition.    Final Clinical Impression(s) / ED Diagnoses Final diagnoses:  Nausea and vomiting, intractability of vomiting not specified, unspecified vomiting type  COVID-19    Rx / DC Orders ED Discharge Orders         Ordered    ondansetron (ZOFRAN ODT) 4 MG disintegrating tablet  Every 8 hours PRN,   Status:  Discontinued        06/23/20 2028           Milagros Loll, MD 07/04/20 2355    Milagros Loll, MD 07/04/20 2355

## 2020-07-05 DIAGNOSIS — Z419 Encounter for procedure for purposes other than remedying health state, unspecified: Secondary | ICD-10-CM | POA: Diagnosis not present

## 2020-08-05 DIAGNOSIS — Z419 Encounter for procedure for purposes other than remedying health state, unspecified: Secondary | ICD-10-CM | POA: Diagnosis not present

## 2020-09-05 DIAGNOSIS — Z419 Encounter for procedure for purposes other than remedying health state, unspecified: Secondary | ICD-10-CM | POA: Diagnosis not present

## 2020-09-18 DIAGNOSIS — Z202 Contact with and (suspected) exposure to infections with a predominantly sexual mode of transmission: Secondary | ICD-10-CM | POA: Diagnosis not present

## 2020-09-18 DIAGNOSIS — Z113 Encounter for screening for infections with a predominantly sexual mode of transmission: Secondary | ICD-10-CM | POA: Diagnosis not present

## 2020-10-03 DIAGNOSIS — Z419 Encounter for procedure for purposes other than remedying health state, unspecified: Secondary | ICD-10-CM | POA: Diagnosis not present

## 2020-11-03 DIAGNOSIS — Z419 Encounter for procedure for purposes other than remedying health state, unspecified: Secondary | ICD-10-CM | POA: Diagnosis not present

## 2020-12-03 DIAGNOSIS — Z419 Encounter for procedure for purposes other than remedying health state, unspecified: Secondary | ICD-10-CM | POA: Diagnosis not present

## 2021-01-03 DIAGNOSIS — Z419 Encounter for procedure for purposes other than remedying health state, unspecified: Secondary | ICD-10-CM | POA: Diagnosis not present

## 2021-02-02 DIAGNOSIS — Z419 Encounter for procedure for purposes other than remedying health state, unspecified: Secondary | ICD-10-CM | POA: Diagnosis not present

## 2021-02-06 ENCOUNTER — Encounter: Payer: Self-pay | Admitting: *Deleted

## 2021-03-05 DIAGNOSIS — Z419 Encounter for procedure for purposes other than remedying health state, unspecified: Secondary | ICD-10-CM | POA: Diagnosis not present

## 2021-03-15 ENCOUNTER — Other Ambulatory Visit (HOSPITAL_COMMUNITY)
Admission: RE | Admit: 2021-03-15 | Discharge: 2021-03-15 | Disposition: A | Discharge status: Home / Self Care | Payer: Medicaid Other | Source: Ambulatory Visit | Attending: Internal Medicine | Admitting: Internal Medicine

## 2021-03-15 ENCOUNTER — Encounter: Payer: Self-pay | Admitting: Internal Medicine

## 2021-03-15 ENCOUNTER — Ambulatory Visit (INDEPENDENT_AMBULATORY_CARE_PROVIDER_SITE_OTHER): Payer: Medicaid Other | Admitting: Internal Medicine

## 2021-03-15 ENCOUNTER — Other Ambulatory Visit: Payer: Self-pay

## 2021-03-15 VITALS — BP 110/73 | HR 73 | Temp 98.4°F | Ht 62.0 in | Wt 166.4 lb

## 2021-03-15 DIAGNOSIS — E669 Obesity, unspecified: Secondary | ICD-10-CM | POA: Insufficient documentation

## 2021-03-15 DIAGNOSIS — B977 Papillomavirus as the cause of diseases classified elsewhere: Secondary | ICD-10-CM | POA: Diagnosis not present

## 2021-03-15 DIAGNOSIS — M5441 Lumbago with sciatica, right side: Secondary | ICD-10-CM | POA: Diagnosis not present

## 2021-03-15 DIAGNOSIS — G8929 Other chronic pain: Secondary | ICD-10-CM

## 2021-03-15 DIAGNOSIS — Z1159 Encounter for screening for other viral diseases: Secondary | ICD-10-CM

## 2021-03-15 DIAGNOSIS — M545 Low back pain, unspecified: Secondary | ICD-10-CM | POA: Diagnosis not present

## 2021-03-15 DIAGNOSIS — F330 Major depressive disorder, recurrent, mild: Secondary | ICD-10-CM | POA: Diagnosis not present

## 2021-03-15 DIAGNOSIS — F5101 Primary insomnia: Secondary | ICD-10-CM

## 2021-03-15 DIAGNOSIS — G4700 Insomnia, unspecified: Secondary | ICD-10-CM | POA: Insufficient documentation

## 2021-03-15 DIAGNOSIS — Z1151 Encounter for screening for human papillomavirus (HPV): Secondary | ICD-10-CM | POA: Insufficient documentation

## 2021-03-15 DIAGNOSIS — Z01419 Encounter for gynecological examination (general) (routine) without abnormal findings: Secondary | ICD-10-CM | POA: Insufficient documentation

## 2021-03-15 DIAGNOSIS — R87810 Cervical high risk human papillomavirus (HPV) DNA test positive: Secondary | ICD-10-CM | POA: Diagnosis not present

## 2021-03-15 LAB — POCT GLYCOSYLATED HEMOGLOBIN (HGB A1C): Hemoglobin A1C: 5.2 % (ref 4.0–5.6)

## 2021-03-15 LAB — GLUCOSE, CAPILLARY: Glucose-Capillary: 96 mg/dL (ref 70–99)

## 2021-03-15 MED ORDER — TRAZODONE HCL 50 MG PO TABS: 50.0000 mg | ORAL_TABLET | Freq: Every day | ORAL | 2 refills | Status: DC

## 2021-03-15 MED ORDER — FLUOXETINE HCL 20 MG PO CAPS: 20.0000 mg | ORAL_CAPSULE | Freq: Every day | ORAL | 3 refills | Status: DC

## 2021-03-15 MED ORDER — NAPROXEN 500 MG PO TABS: 500.0000 mg | ORAL_TABLET | Freq: Two times a day (BID) | ORAL | 0 refills | Status: AC

## 2021-03-15 NOTE — Assessment & Plan Note (Addendum)
Patient reports insomnia for a month. Reports dealing with insomnia before and said it started after her fourth child. Has tried [place] and a gummie with melatonin. Melatonin has not helped and Ambien helped but she felt groggy the next day.  Assessment/Plan: Insomnia. Patient has been out of her Prozac , reports feeling more anxious and this may cause her have diffuclty sleeping. See concern for hyperthyrodism last year after visit, and labs suggest subclinical hyperthyroidism or possible hyperthyroidism , will need T3 to determine. Will follow this up with patient.  - traZODone (DESYREL) 50 MG tablet; Take 1 tablet (50 mg total) by mouth at bedtime.  Dispense: 30 tablet; Refill: 2

## 2021-03-15 NOTE — Assessment & Plan Note (Signed)
Patient here for a 1 year follow up pap smear. Pap 3/21, normal cytology, HR HPV not typed.   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 - Cytology -Pap Smear

## 2021-03-15 NOTE — Progress Notes (Signed)
   CC: Lower back pain, HPV , chronic midline low back pain   HPI:[name] is a 40 y.o. female who presents for evaluation of HPV, acute on chronic low back pain with sciatica, and need for one time hepatitis C screening  Please see individual problem based A/P for details.  Past Medical History:  Diagnosis Date   Depression    Scoliosis    Review of Systems:   Review of Systems  Constitutional:  Negative for chills and fever.  Musculoskeletal:  Positive for back pain. Negative for falls and joint pain.  Neurological:  Negative for sensory change and weakness.    Physical Exam: Vitals:   03/15/21 1044  BP: 110/73  Pulse: 73  Temp: 98.4 F (36.9 C)  TempSrc: Oral  SpO2: 96%  Weight: 166 lb 6.4 oz (75.5 kg)  Height: 5\' 2"  (1.575 m)   General: energetic , NAD HEENT: Conjunctiva nl , antiicteric sclerae, moist mucous membranes, no exudate or erythema Cardiovascular: Normal rate, regular rhythm.  No murmurs, rubs, or gallops Pulmonary : Equal breath sounds, No wheezes, rales, or rhonchi Abdominal: soft, nontender,  bowel sounds present Ext: No edema in lower extremities, no tenderness to palpation of lower extremities.  Physical Exam Exam conducted with a chaperone present.  Genitourinary:    General: Normal vulva.     Exam position: Lithotomy position.     Pubic Area: No rash.      Labia:        Right: No rash or lesion.        Left: No rash or lesion.      Urethra: No urethral swelling.     Vagina: No signs of injury.     Cervix: Normal.     Uterus: Normal.      Adnexa: Right adnexa normal and left adnexa normal.     Rectum: Normal.     Assessment & Plan:   See Encounters Tab for problem based charting.  Patient discussed with Dr. 

## 2021-03-15 NOTE — Patient Instructions (Addendum)
Thank you for trusting me with your care.   Labs ordered  - Cytology -Pap Smear - Hepatitis C antibody - BMP8+Anion Gap  - Lipid Profile - POC Hbg A1C   Imaging  - DG THORACOLUMABAR SPINE; Future

## 2021-03-15 NOTE — Assessment & Plan Note (Addendum)
HPI: Patient is here with a concern for low back pain. She was evaluated in 2021 for months of low back. In addition as part of a wellness program at her work she was sent to a [place]. The xray showed scoliosis. She remembered being told as a child she had scoliosis and also she would grow out of it. After her chiropractic adjustment she felt relief from pain for a few days, but the pain in her back has returned worse. She know has developed a shooting pain which radiates from her back down outer hip then to knee and ending in her great toe.   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  - BMP8+Anion Gap - DG THORACOLUMABAR SPINE; Future

## 2021-03-15 NOTE — Assessment & Plan Note (Signed)
°   Need for hepatitis C screening test °- Hepatitis C antibody ° °

## 2021-03-16 ENCOUNTER — Telehealth: Payer: Self-pay

## 2021-03-16 LAB — BMP8+ANION GAP
Anion Gap: 14 mmol/L (ref 10.0–18.0)
BUN/Creatinine Ratio: 16 (ref 9–23)
BUN: 10 mg/dL (ref 6–20)
CO2: 21 mmol/L (ref 20–29)
Calcium: 9 mg/dL (ref 8.7–10.2)
Chloride: 107 mmol/L — ABNORMAL HIGH (ref 96–106)
Creatinine, Ser: 0.61 mg/dL (ref 0.57–1.00)
Glucose: 85 mg/dL (ref 65–99)
Potassium: 4.8 mmol/L (ref 3.5–5.2)
Sodium: 142 mmol/L (ref 134–144)
eGFR: 117 mL/min/{1.73_m2} (ref >59)

## 2021-03-16 LAB — HEPATITIS C ANTIBODY: Hep C Virus Ab: <0.1 s/co ratio (ref 0.0–0.9)

## 2021-03-16 LAB — LIPID PANEL
Chol/HDL Ratio: 2.1 ratio (ref 0.0–4.4)
Cholesterol, Total: 175 mg/dL (ref 100–199)
HDL: 83 mg/dL (ref >39)
LDL Chol Calc (NIH): 80 mg/dL (ref 0–99)
Triglycerides: 61 mg/dL (ref 0–149)
VLDL Cholesterol Cal: 12 mg/dL (ref 5–40)

## 2021-03-16 NOTE — Telephone Encounter (Signed)
Requesting lab results, please call pt back.  

## 2021-03-19 ENCOUNTER — Telehealth: Payer: Self-pay | Admitting: Internal Medicine

## 2021-03-20 ENCOUNTER — Telehealth: Payer: Self-pay | Admitting: Internal Medicine

## 2021-03-20 DIAGNOSIS — E0590 Thyrotoxicosis, unspecified without thyrotoxic crisis or storm: Secondary | ICD-10-CM

## 2021-03-20 NOTE — Telephone Encounter (Signed)
Reviewed pap smear results with patient. She was positive for HPV and negative for intraepithelial lesion or malignancy.  Given second positive HPV we will send patient for colposcopy.  She has seen Nigel Bridgeman, CNM with [facility] in the past. I will refer to this practice.   In addition she was unable to obtain the x-ray ordered, but will try to come by for this in the next week. Also to obtain Thyroid labs in our clinic. Orders have been entered.

## 2021-03-21 ENCOUNTER — Other Ambulatory Visit: Payer: Self-pay | Admitting: Internal Medicine

## 2021-03-21 DIAGNOSIS — M545 Low back pain, unspecified: Secondary | ICD-10-CM

## 2021-03-21 DIAGNOSIS — G8929 Other chronic pain: Secondary | ICD-10-CM

## 2021-03-21 MED ORDER — METHOCARBAMOL 750 MG PO TABS: ORAL_TABLET | ORAL | 0 refills | Status: AC

## 2021-03-21 NOTE — Progress Notes (Signed)
Patient continues to have back pain while taking naproxen.  She is scheduled for x-ray.  We will send prescription for muscle relaxer.

## 2021-03-29 LAB — CYTOLOGY - PAP
Adequacy: ABSENT
Comment: NEGATIVE
Comment: NEGATIVE
Diagnosis: NEGATIVE
HPV 16: NEGATIVE
HPV 18 / 45: NEGATIVE
High risk HPV: POSITIVE — AB

## 2021-04-05 DIAGNOSIS — Z419 Encounter for procedure for purposes other than remedying health state, unspecified: Secondary | ICD-10-CM | POA: Diagnosis not present

## 2021-04-05 NOTE — Progress Notes (Signed)
Internal Medicine Clinic Attending  Case discussed with Dr. Steen  At the time of the visit.  We reviewed the resident's history and exam and pertinent patient test results.  I agree with the assessment, diagnosis, and plan of care documented in the resident's note.  

## 2021-04-27 ENCOUNTER — Other Ambulatory Visit: Payer: Self-pay | Admitting: Internal Medicine

## 2021-04-27 ENCOUNTER — Telehealth: Payer: Self-pay | Admitting: Internal Medicine

## 2021-04-27 DIAGNOSIS — B977 Papillomavirus as the cause of diseases classified elsewhere: Secondary | ICD-10-CM

## 2021-04-27 NOTE — Telephone Encounter (Signed)
Please advise if you will place a new Referral for this patient to follow up with her pap Smear with her GYN office [facility].

## 2021-05-05 DIAGNOSIS — Z419 Encounter for procedure for purposes other than remedying health state, unspecified: Secondary | ICD-10-CM | POA: Diagnosis not present

## 2021-06-05 DIAGNOSIS — Z419 Encounter for procedure for purposes other than remedying health state, unspecified: Secondary | ICD-10-CM | POA: Diagnosis not present

## 2021-06-11 ENCOUNTER — Telehealth: Payer: Self-pay

## 2021-06-11 NOTE — Telephone Encounter (Signed)
Patient informed our scheduler that she was only given a little information regarding the colposcopy procedure, would like to know more about the procedure. I contacted patient, procedure and prior procedure protocols discussed. Patient verbalized understanding.

## 2021-07-02 ENCOUNTER — Telehealth: Payer: Self-pay

## 2021-07-02 NOTE — Telephone Encounter (Signed)
Per patient states she misplaced her medications. Please call pt back.

## 2021-07-02 NOTE — Telephone Encounter (Signed)
Called pt who stated she had misplaced her medications but she mainly concerned about Fluoxetine. Stated she has been w/o it times 3 weeks. Informed we do not have samples. Instructed pt to call the pharmacy to see if it's time for a refill; if not, ask for the cost to pay out of pocket. Stated she will.

## 2021-07-05 DIAGNOSIS — Z419 Encounter for procedure for purposes other than remedying health state, unspecified: Secondary | ICD-10-CM | POA: Diagnosis not present

## 2021-07-19 ENCOUNTER — Other Ambulatory Visit: Payer: Self-pay | Admitting: *Deleted

## 2021-07-19 ENCOUNTER — Other Ambulatory Visit: Payer: Self-pay

## 2021-07-19 ENCOUNTER — Ambulatory Visit: Payer: Self-pay | Admitting: *Deleted

## 2021-07-19 VITALS — BP 110/72 | Wt 165.2 lb

## 2021-07-19 DIAGNOSIS — Z1239 Encounter for other screening for malignant neoplasm of breast: Secondary | ICD-10-CM

## 2021-07-19 DIAGNOSIS — R87810 Cervical high risk human papillomavirus (HPV) DNA test positive: Secondary | ICD-10-CM

## 2021-07-19 DIAGNOSIS — Z1231 Encounter for screening mammogram for malignant neoplasm of breast: Secondary | ICD-10-CM

## 2021-07-19 NOTE — Progress Notes (Signed)
[name] is a 40 y.o. female who presents to [facility] clinic today with no complaints. Patient referred to [facility] by [facility] due to having an abnormal Pap smear 03/15/2021 that a colposcopy is recommended for follow-up.   Pap Smear: Pap smear not completed today. Last Pap smear was 03/15/2021 at [facility] clinic and was normal with positive HPV. Patients previous Pap smear 10/06/2019 was normal with positive HPV. Per patient has history of an abnormal Pap smear. Last two Pap smear resultsis available in Epic.   Physical exam: Breasts Breasts symmetrical. No skin abnormalities bilateral breasts. No nipple retraction bilateral breasts. No nipple discharge bilateral breasts. No lymphadenopathy. No lumps palpated bilateral breasts. No complaints of pain or tenderness on exam.       Pelvic/Bimanual Pap is not indicated today per BCCCP guidelines.   Smoking History: Patient is a current smoker. Discussed smoking cessation with patient. Referred to the [facility] Quitline and gave resources to the free smoking cessation classes at [facility].   Patient Navigation: Patient education provided. Access to services provided for patient through BCCCP program.    Breast and Cervical Cancer Risk Assessment: Patient does not have family history of breast cancer, known genetic mutations, or radiation treatment to the chest before age 53. Patient does not have history of cervical dysplasia, immunocompromised, or DES exposure in-utero.  Risk Assessment     Risk Scores       07/19/2021   Last edited by: Narda Rutherford, LPN   5-year risk: 0.5 %   Lifetime risk: 9.7 %           A: BCCCP exam without pap smear No complaints.  P: Referred patient to the [facility] for a screening mammogram on mobile unit. Appointment scheduled Tuesday, August 21, 2020 at 0830.  Referred patient to the [facility] for [facility] for a colposcopy  to follow up for her abnormal Pap smear. Appointment scheduled Monday, July 23, 2021 at 0815.  Tammy Heidelberg, RN 07/19/2021 12:38 PM

## 2021-07-19 NOTE — Patient Instructions (Signed)
Explained breast self awareness with Tammy May. Patient did not need a Pap smear today due to last Pap smear was 03/15/2021. Explained the colposcopy the recommended follow-up for her abnormal Pap smear. Referred patient to the [facility] for [facility] for a colposcopy to follow up for her abnormal Pap smear. Appointment scheduled Monday, July 23, 2021 at 0815. Referred patient to the [facility] for a screening mammogram on mobile unit. Appointment scheduled Tuesday, August 21, 2020 at 0830. Patient aware of appointments and will be there. Discussed smoking cessation with patient. Referred to the [facility] Quitline and gave resources to the free smoking cessation classes at [facility]. Tammy May verbalized understanding.  Tammy May, Tammy Maser, RN 12:38 PM

## 2021-07-23 ENCOUNTER — Encounter: Payer: Self-pay | Admitting: Obstetrics and Gynecology

## 2021-07-23 ENCOUNTER — Telehealth: Payer: Self-pay

## 2021-07-23 ENCOUNTER — Ambulatory Visit: Payer: Medicaid Other | Admitting: Obstetrics and Gynecology

## 2021-07-23 NOTE — Telephone Encounter (Signed)
Called patient to follow up on missed appt for colposcopy. Call cannot be completed as dialed. MyChart message sent.

## 2021-08-05 DIAGNOSIS — Z419 Encounter for procedure for purposes other than remedying health state, unspecified: Secondary | ICD-10-CM | POA: Diagnosis not present

## 2021-08-15 IMAGING — CT CT ABD-PELV W/ CM
2 of 4 series · 16 of 46 positions shown, 18 images · IV contrast (APPLIED)
Comparison: None.

CLINICAL DATA: Abdominal pain

EXAM:
CT ABDOMEN AND PELVIS WITH CONTRAST
TECHNIQUE: Multidetector CT imaging of the abdomen and pelvis was performed
using the standard protocol following bolus administration of
intravenous contrast.
CONTRAST:  100mL OMNIPAQUE IOHEXOL 300 MG/ML  SOLN

[Series 3: abd/ pelvis 5.0 i30f 2 · axial · 0.93mm/px · z∈[+768,+1178]mm · 13 of 90 slices shown, 15 images]
[im 4/90  soft-tissue]
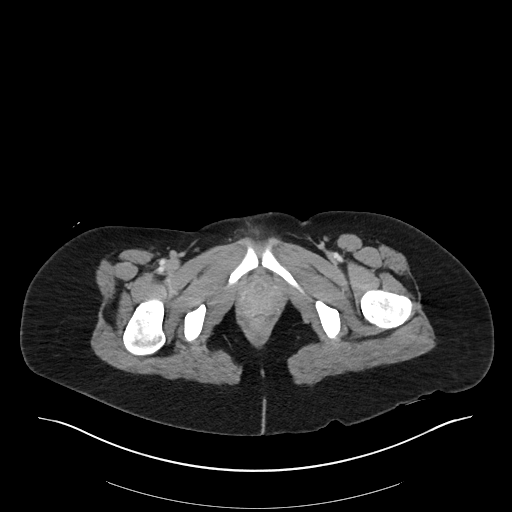
[im 4/90  bone]
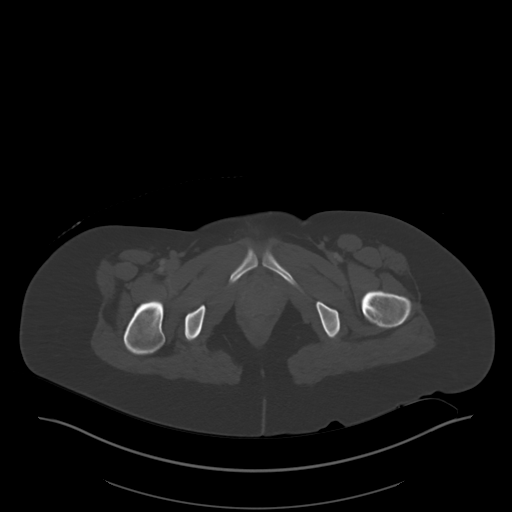
[im 12/90  soft-tissue]
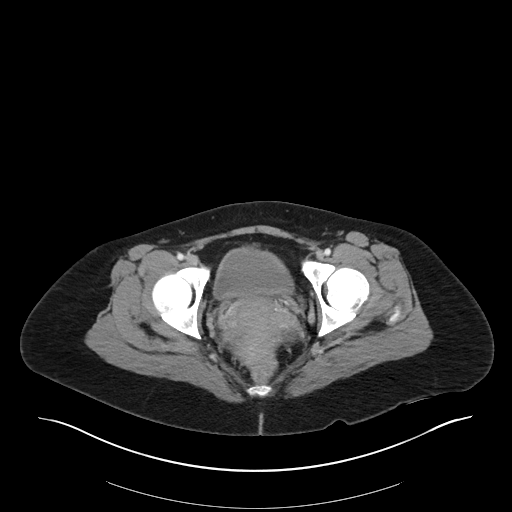
[im 20/90  soft-tissue]
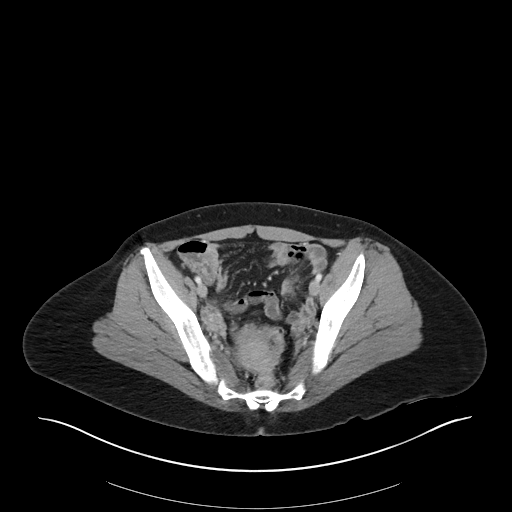
[im 24/90  soft-tissue]
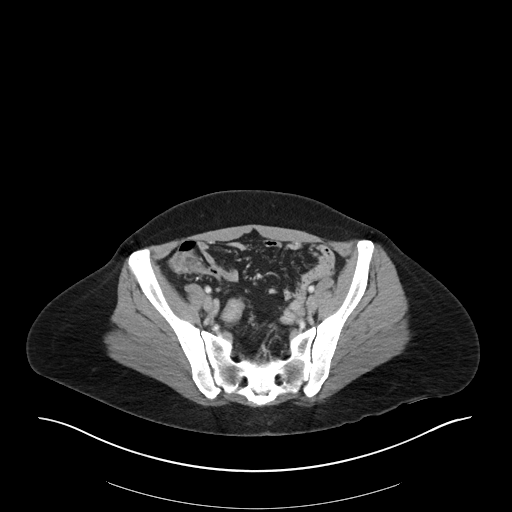
[im 31/90  soft-tissue]
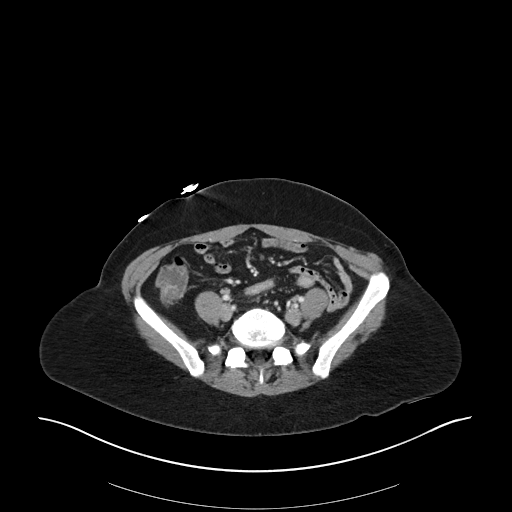
[im 39/90  soft-tissue]
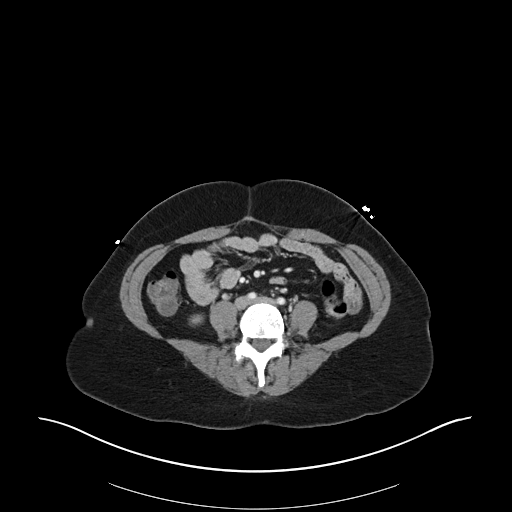
[im 47/90  soft-tissue]
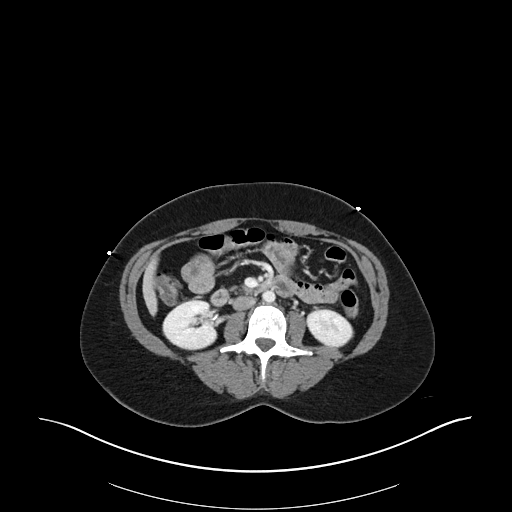
[im 51/90  soft-tissue]
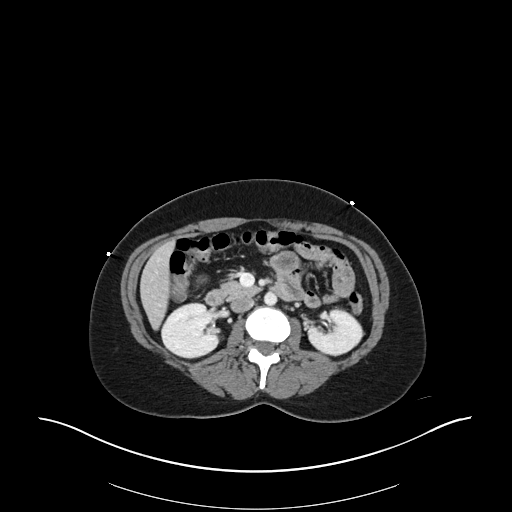
[im 59/90  soft-tissue]
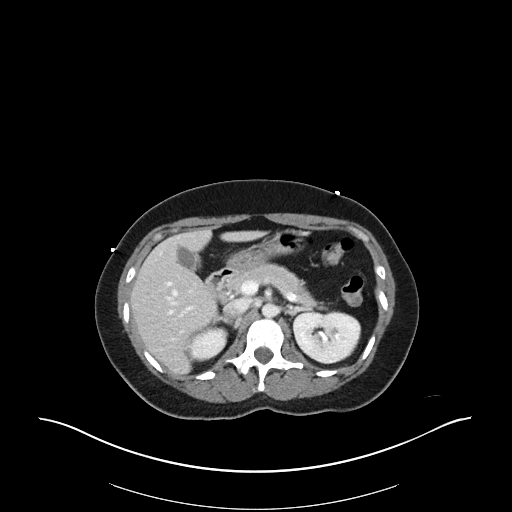
[im 59/90  bone]
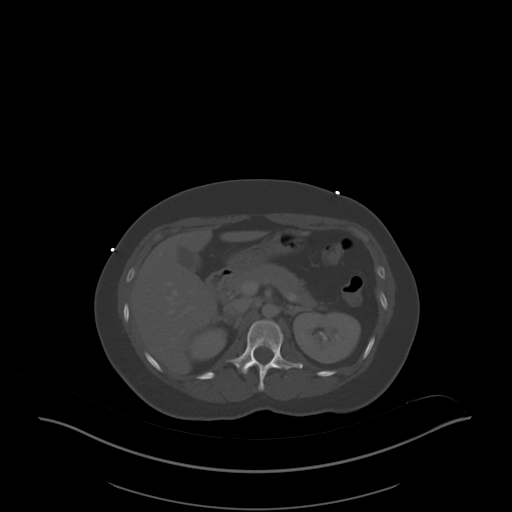
[im 66/90  soft-tissue]
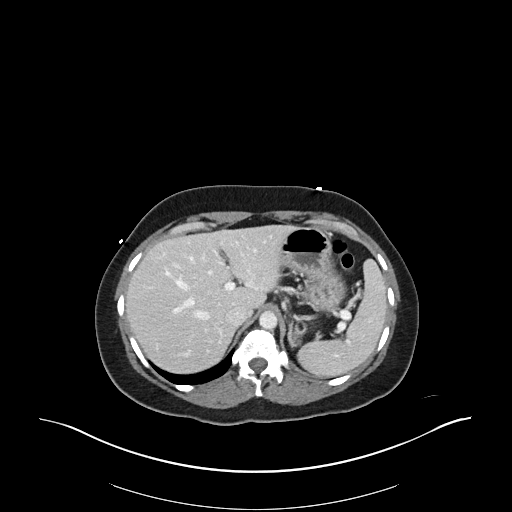
[im 70/90  soft-tissue]
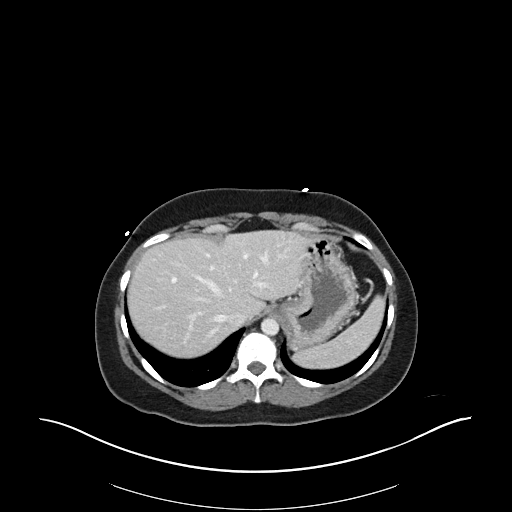
[im 78/90  soft-tissue]
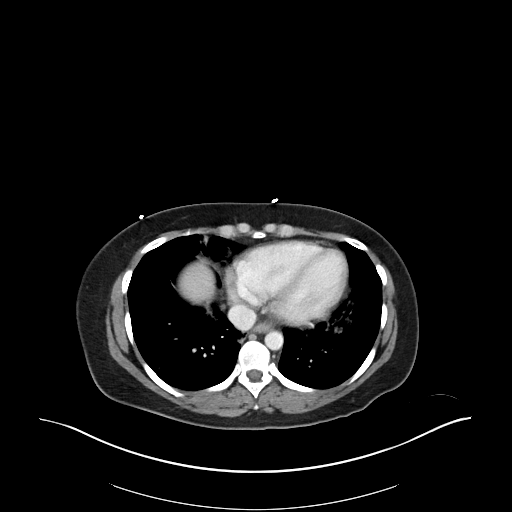
[im 86/90  soft-tissue]
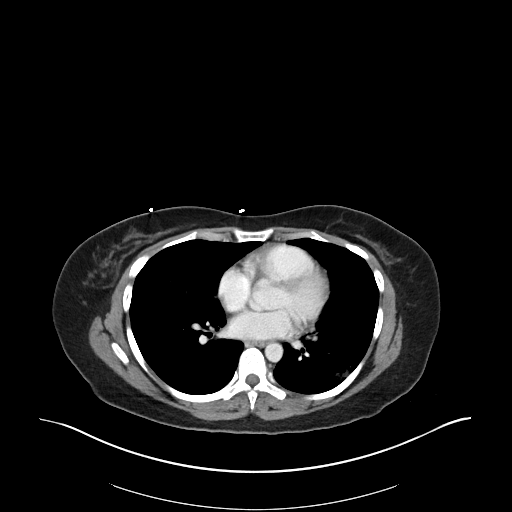

[Series 6: coronal soft tissue · coronal · 0.77mm/px · 3 of 90 slices shown]
[im 30/90  soft-tissue]
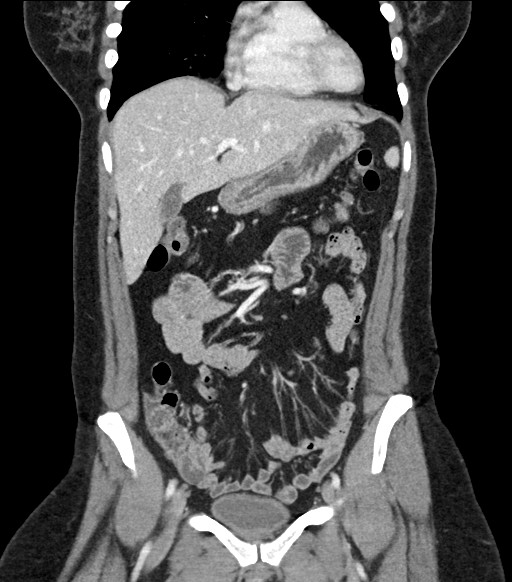
[im 40/90  soft-tissue]
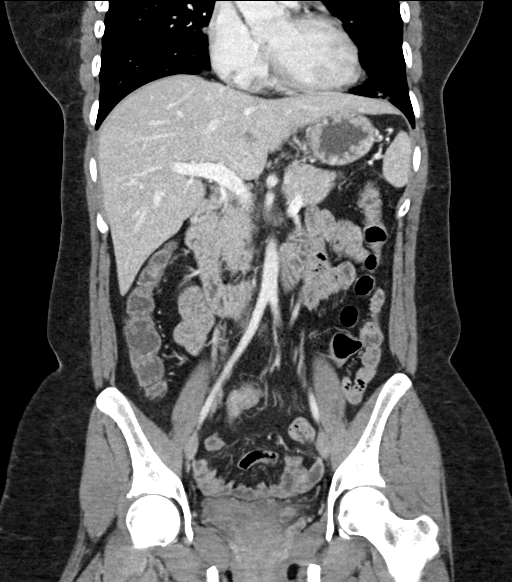
[im 50/90  soft-tissue]
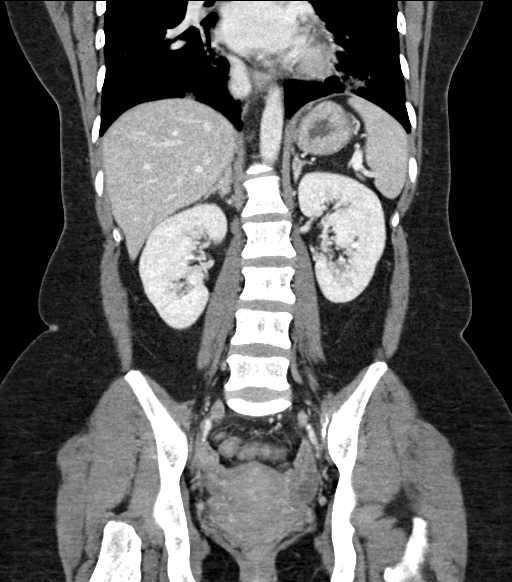

[16 of 46 positions shown; findings below may reference images not displayed]

FINDINGS: Lower chest: The visualized heart size within normal limits. No
pericardial fluid/thickening.

No hiatal hernia.

Multifocal patchy airspace opacities are seen within the right
middle lobe and left lung base. There is also a small patchy
airspace opacities in the posterior right lung base.

Hepatobiliary: The liver is normal in density without focal
abnormality.The main portal vein is patent. No evidence of calcified
gallstones, gallbladder wall thickening or biliary dilatation.

Pancreas: Unremarkable. No pancreatic ductal dilatation or
surrounding inflammatory changes.

Spleen: Normal in size without focal abnormality.

Adrenals/Urinary Tract: Both adrenal glands appear normal. Scattered
calcifications seen in the upper pole of the right kidney the
largest measuring 6 mm. No renal or collecting system calculi are
noted. Bladder is unremarkable.

Stomach/Bowel: The stomach, small bowel, and colon are normal in
appearance. No inflammatory changes, wall thickening, or obstructive
findings.The appendix is normal.

Vascular/Lymphatic: There are no enlarged mesenteric,
retroperitoneal, or pelvic lymph nodes. No significant vascular
findings are present.

Reproductive: The uterus and adnexa are unremarkable.

Other: No evidence of abdominal wall mass or hernia.

Musculoskeletal: No acute or significant osseous findings.
IMPRESSION: Multifocal patchy airspace opacities at both lung bases, consistent
with multifocal pneumonia.

Nonobstructing right renal calculus.

## 2021-08-17 ENCOUNTER — Telehealth: Payer: Self-pay

## 2021-08-17 NOTE — Telephone Encounter (Signed)
Patient called requesting duplicate copy of [facility] pink card mailed to her home address.

## 2021-08-21 ENCOUNTER — Other Ambulatory Visit: Payer: Self-pay

## 2021-08-21 ENCOUNTER — Ambulatory Visit
Admission: RE | Admit: 2021-08-21 | Discharge: 2021-08-21 | Disposition: A | Discharge status: Home / Self Care | Payer: Medicaid Other | Source: Ambulatory Visit | Attending: Obstetrics and Gynecology | Admitting: Obstetrics and Gynecology

## 2021-08-21 DIAGNOSIS — Z1231 Encounter for screening mammogram for malignant neoplasm of breast: Secondary | ICD-10-CM

## 2021-09-04 ENCOUNTER — Encounter: Payer: Self-pay | Admitting: Internal Medicine

## 2021-09-05 DIAGNOSIS — Z419 Encounter for procedure for purposes other than remedying health state, unspecified: Secondary | ICD-10-CM | POA: Diagnosis not present

## 2021-09-14 ENCOUNTER — Encounter: Payer: Self-pay | Admitting: Family Medicine

## 2021-09-14 ENCOUNTER — Other Ambulatory Visit (HOSPITAL_COMMUNITY)
Admission: RE | Admit: 2021-09-14 | Discharge: 2021-09-14 | Disposition: A | Discharge status: Home / Self Care | Payer: Medicaid Other | Source: Ambulatory Visit | Attending: Obstetrics and Gynecology | Admitting: Obstetrics and Gynecology

## 2021-09-14 ENCOUNTER — Ambulatory Visit (INDEPENDENT_AMBULATORY_CARE_PROVIDER_SITE_OTHER): Payer: Medicaid Other | Admitting: Family Medicine

## 2021-09-14 ENCOUNTER — Other Ambulatory Visit: Payer: Self-pay

## 2021-09-14 VITALS — BP 110/74 | HR 84 | Wt 166.2 lb

## 2021-09-14 DIAGNOSIS — R87810 Cervical high risk human papillomavirus (HPV) DNA test positive: Secondary | ICD-10-CM

## 2021-09-14 DIAGNOSIS — B977 Papillomavirus as the cause of diseases classified elsewhere: Secondary | ICD-10-CM | POA: Diagnosis not present

## 2021-09-14 DIAGNOSIS — Z3202 Encounter for pregnancy test, result negative: Secondary | ICD-10-CM

## 2021-09-14 DIAGNOSIS — N879 Dysplasia of cervix uteri, unspecified: Secondary | ICD-10-CM | POA: Diagnosis not present

## 2021-09-14 DIAGNOSIS — Z1151 Encounter for screening for human papillomavirus (HPV): Secondary | ICD-10-CM | POA: Diagnosis not present

## 2021-09-14 LAB — POCT PREGNANCY, URINE: Preg Test, Ur: NEGATIVE

## 2021-09-14 MED ORDER — IBUPROFEN 800 MG PO TABS
800.0000 mg | ORAL_TABLET | Freq: Once | ORAL | Status: AC
  Administered 2021-09-14: 800 mg via ORAL

## 2021-09-14 MED ORDER — ACETAMINOPHEN 500 MG PO TABS
500.0000 mg | ORAL_TABLET | Freq: Once | ORAL | Status: AC
  Administered 2021-09-14: 500 mg via ORAL

## 2021-09-14 NOTE — Progress Notes (Signed)
° ° °  GYNECOLOGY CLINIC COLPOSCOPY PROCEDURE NOTE  41 y.o. 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  Pregnancy test: Lab Results  Component Value Date   PREGTESTUR NEGATIVE 09/06/2018    No Known Allergies  Patient given informed consent, signed copy in the chart, time out was performed.    Placed in lithotomy position. Cervix viewed with speculum and colposcope after application of acetic acid.   Colposcopy Adequacy Cervix fully visualized: Yes  SCJ fully visualized: Yes  Colposcopy Findings Mix of faint and dense acetowhite lesion(s) noted circumferentially  Corresponding biopsies were obtained. Attempted biopsies at 10, 2, 4, and 8 o clock, but only biopsy from 2 o clock felt to be adequate due to difficulty maneuvering instrument into cervical canal where lesions were visualized. Given difficulty of getting biopsies an ECC was obtained despite adequate visualization of the SCJ.  All specimens were labeled and sent to pathology.  Hemostatic measures: Pressure and Monsel's solution  Complications: none  Patient very uncomfortable during procedure and tearful after. Given 800mg  Ibuprofen and 500mg  tylenol.  OBGyn Exam  Colposcopy Impressions High grade features given circumferential mild to densely white lesion  Plan Treatment plan pending biopsy results, per patient preference they will be communicated by telephone, if no response OK to send MyChart message.  Patient was given post procedure instructions.  Will follow up pathology and manage accordingly; patient will be  contacted with results and recommendations.  Routine preventative health maintenance measures emphasized.  , MD/MPH Attending Family Medicine Physician, [facility] for [facility]

## 2021-09-18 LAB — SURGICAL PATHOLOGY

## 2021-10-03 DIAGNOSIS — Z419 Encounter for procedure for purposes other than remedying health state, unspecified: Secondary | ICD-10-CM | POA: Diagnosis not present

## 2021-11-03 DIAGNOSIS — Z419 Encounter for procedure for purposes other than remedying health state, unspecified: Secondary | ICD-10-CM | POA: Diagnosis not present

## 2021-12-03 DIAGNOSIS — Z419 Encounter for procedure for purposes other than remedying health state, unspecified: Secondary | ICD-10-CM | POA: Diagnosis not present

## 2022-01-03 DIAGNOSIS — Z419 Encounter for procedure for purposes other than remedying health state, unspecified: Secondary | ICD-10-CM | POA: Diagnosis not present

## 2022-02-02 DIAGNOSIS — Z419 Encounter for procedure for purposes other than remedying health state, unspecified: Secondary | ICD-10-CM | POA: Diagnosis not present

## 2022-02-20 ENCOUNTER — Encounter: Payer: Self-pay | Admitting: Student

## 2022-02-20 ENCOUNTER — Other Ambulatory Visit: Payer: Self-pay

## 2022-02-20 ENCOUNTER — Ambulatory Visit: Payer: Medicaid Other | Admitting: Student

## 2022-02-20 ENCOUNTER — Other Ambulatory Visit (HOSPITAL_COMMUNITY)
Admission: RE | Admit: 2022-02-20 | Discharge: 2022-02-20 | Disposition: A | Discharge status: Home / Self Care | Payer: Medicaid Other | Source: Ambulatory Visit | Attending: Internal Medicine | Admitting: Internal Medicine

## 2022-02-20 VITALS — BP 102/64 | HR 92 | Temp 98.0°F | Ht 63.0 in | Wt 168.4 lb

## 2022-02-20 DIAGNOSIS — G8929 Other chronic pain: Secondary | ICD-10-CM | POA: Diagnosis not present

## 2022-02-20 DIAGNOSIS — N898 Other specified noninflammatory disorders of vagina: Secondary | ICD-10-CM | POA: Diagnosis not present

## 2022-02-20 DIAGNOSIS — M5441 Lumbago with sciatica, right side: Secondary | ICD-10-CM

## 2022-02-20 MED ORDER — NAPROXEN 500 MG PO TABS: 500.0000 mg | ORAL_TABLET | Freq: Two times a day (BID) | ORAL | 0 refills | Status: AC

## 2022-02-20 MED ORDER — METHOCARBAMOL 750 MG PO TABS: 750.0000 mg | ORAL_TABLET | Freq: Four times a day (QID) | ORAL | 0 refills | Status: AC

## 2022-02-20 NOTE — Assessment & Plan Note (Addendum)
Patient reports chronic low back pain. Pain can be aching or sharp. Worse at night.  Massage therapy helps with pain. Ibuprofen does not provide relief. Denies any red flag symptoms. Patient reports history of scoliosis. She has an order for a thoracolumbar spine x-ray which she plans to complete today.  On exam, mild curvature noted in thoracolumbar spine.  Paraspinal muscle tenderness. Limited rotation due to muscle tightness.   Plan -complete thoracolumbar spine x-ray today -Referral to PT -Robaxin 750 mg for 2 weeks -Naproxen 500 mg twice daily for 2 weeks

## 2022-02-20 NOTE — Patient Instructions (Addendum)
Thank you, Tammy May for allowing [place] to provide your care today. Today we discussed vaginal discharge, low back pain and medication refill.    Vaginal Discharge:  -I will call or message you with the results.  Low Back Pain -Please go to radiology to have the x-ray done today or as soon as possible.  -Physical therapy referral sent today.  Depression/Anxiety -continue Prozac 20 mg daily   I have ordered the following labs for you:  Lab Orders  No laboratory test(s) ordered today     Referrals ordered today:   Referral Orders         Ambulatory referral to Physical Therapy       I have ordered the following medication/changed the following medications:   Stop the following medications: There are no discontinued medications.   Start the following medications: No orders of the defined types were placed in this encounter.    Follow up: 3 months    Should you have any questions or concerns please call the internal medicine clinic at [phone].    Rana Snare, D.O. [facility]

## 2022-02-20 NOTE — Progress Notes (Addendum)
CC: Vaginal Discharge, Chronic Low Back Pain   HPI:  Tammy May is a 41 y.o. female living with a history stated below and presents today for vaginal discharge and chronic low back pain. Please see problem based assessment and plan for additional details.  Past Medical History:  Diagnosis Date   Depression    Scoliosis    Trichomoniasis     Current Outpatient Medications on File Prior to Visit  Medication Sig Dispense Refill   FLUoxetine (PROZAC) 20 MG capsule Take 1 capsule (20 mg total) by mouth daily. 90 capsule 3   traZODone (DESYREL) 50 MG tablet Take 1 tablet (50 mg total) by mouth at bedtime. 30 tablet 2   No current facility-administered medications on file prior to visit.    Review of Systems: ROS negative except for what is noted on the assessment and plan.  Vitals:   02/20/22 1308 02/20/22 1420  BP: (!) 100/58 102/64  Pulse: (!) 101 92  Temp: 98 F (36.7 C)   TempSrc: Oral   SpO2: 99%   Weight: 168 lb 6.4 oz (76.4 kg)   Height: 5\' 3"  (1.6 m)     Physical Exam Exam conducted with a chaperone present.  Constitutional:      General: She is not in acute distress.    Appearance: Normal appearance. She is not ill-appearing.  HENT:     Head: Normocephalic and atraumatic.  Cardiovascular:     Rate and Rhythm: Regular rhythm. Tachycardia present.  Pulmonary:     Effort: Pulmonary effort is normal.     Breath sounds: Normal breath sounds.  Abdominal:     General: Abdomen is flat. There is no distension.     Palpations: Abdomen is soft.     Tenderness: There is no abdominal tenderness.  Genitourinary:    Cervix: Discharge present. No erythema.     Comments:  -Speculum exam showed thick white discharge.  -External genitalia unremarkable. -Vaginal wall mucosa unremarkable.  -Cervix visualized and is unremarkable (closed in appearance) Musculoskeletal:     Thoracic back: Tenderness present. Scoliosis present.     Lumbar back: Tenderness present.  Decreased range of motion. Scoliosis present.  Neurological:     Mental Status: She is alert and oriented to person, place, and time.  Psychiatric:        Mood and Affect: Mood normal.        Behavior: Behavior normal.    Assessment & Plan:   Chronic low back pain with right-sided sciatica Patient reports chronic low back pain. Pain can be aching or sharp. Worse at night.  Massage therapy helps with pain. Ibuprofen does not provide relief. Denies any red flag symptoms. Patient reports history of scoliosis. She has an order for a thoracolumbar spine x-ray which she plans to complete today.  On exam, mild curvature noted in thoracolumbar spine.  Paraspinal muscle tenderness. Limited rotation due to muscle tightness.   Plan -complete thoracolumbar spine x-ray today -Referral to PT -Robaxin 750 mg for 2 weeks -Naproxen 500 mg twice daily for 2 weeks  Vaginal discharge Patient reports vaginal itching and discharge for 2 days. She describes the discharge as white with a smell. Sexual history includes 1 female partner.  History of BV and trichomoniasis in 2021, treated with metronidazole.  Last menstrual cycle was last week. Last sexual intercourse was 2 weeks ago.  Pelvic exam performed today and positive for vaginal discharge. Cervix non-erythematous. No cervical motion wall tenderness. Patient agrees to have Pap  smear and STI testing. Female chaperone Dewayne Hatch) was present during the exam.  Plan -Pap smear pending -Cervicovaginal swab pending for chlamydia, gonorrhea, trichomonas, candida, and BV   ADDENDUM 7/21: Swab positive for trichomonas and BV. Will start Metronidazole 500 mg BID for 7 days. Advised pt to have her partner tested and evaluated by his PCP. Pt understands and agrees with plan.    Patient seen with Dr. Rondall Allegra, D.O. [facility], PGY-1 Phone: [phone] Date 02/22/2022 Time 4:33 PM

## 2022-02-20 NOTE — Assessment & Plan Note (Addendum)
Patient reports vaginal itching and discharge for 2 days. She describes the discharge as white with a smell. Sexual history includes 1 female partner.  History of BV and trichomoniasis in 2021, treated with metronidazole.  Last menstrual cycle was last week. Last sexual intercourse was 2 weeks ago.  Pelvic exam performed today and positive for vaginal discharge. Cervix non-erythematous. No cervical motion wall tenderness. Patient agrees to have Pap smear and STI testing. Female chaperone Dewayne Hatch) was present during the exam.  Plan -Pap smear pending -Cervicovaginal swab pending for chlamydia, gonorrhea, trichomonas, candida, and BV   ADDENDUM 7/21: Swab positive for trichomonas and BV. Will start Metronidazole 500 mg BID for 7 days. Advised pt to have her partner tested and evaluated by his PCP. Pt understands and agrees with plan.

## 2022-02-21 LAB — CERVICOVAGINAL ANCILLARY ONLY
Bacterial Vaginitis (gardnerella): POSITIVE — AB
Candida Glabrata: NEGATIVE
Candida Vaginitis: NEGATIVE
Chlamydia: NEGATIVE
Comment: NEGATIVE
Comment: NEGATIVE
Comment: NEGATIVE
Comment: NEGATIVE
Comment: NEGATIVE
Comment: NORMAL
Neisseria Gonorrhea: NEGATIVE
Trichomonas: POSITIVE — AB

## 2022-02-22 LAB — CYTOLOGY - PAP
Comment: NEGATIVE
Diagnosis: NEGATIVE
High risk HPV: NEGATIVE

## 2022-02-22 MED ORDER — METRONIDAZOLE 500 MG PO TABS: 500.0000 mg | ORAL_TABLET | Freq: Two times a day (BID) | ORAL | 0 refills | Status: AC

## 2022-02-22 NOTE — Addendum Note (Signed)
Addended by: Rana Snare on: 02/22/2022 04:33 PM   Modules accepted: Orders

## 2022-02-23 NOTE — Progress Notes (Signed)
Internal Medicine Clinic Attending  I saw and evaluated the patient.  I personally confirmed the key portions of the history and exam documented by Dr. Sherrilee Gilles and I reviewed pertinent patient test results.  The assessment, diagnosis, and plan were formulated together and I agree with the documentation in the resident's note.  REcurrent BV may merit longer duration of therapy to ensure eradication.

## 2022-03-05 DIAGNOSIS — Z419 Encounter for procedure for purposes other than remedying health state, unspecified: Secondary | ICD-10-CM | POA: Diagnosis not present

## 2022-03-05 NOTE — Therapy (Signed)
OUTPATIENT PHYSICAL THERAPY THORACOLUMBAR EVALUATION   Patient Name: Tammy May MRN: [id number] DOB:1981/04/11, 41 y.o., female Today's Date: 03/06/2022   PT End of Session - 03/06/22 1433     Visit Number 1    Number of Visits 9    Date for PT Re-Evaluation 05/08/22    Authorization Type [employer]    Authorization Time Period Pending auth    PT Start Time 1402    PT Stop Time 1440    PT Time Calculation (min) 38 min    Activity Tolerance Patient tolerated treatment well    Behavior During Therapy [facility] for tasks assessed/performed             Past Medical History:  Diagnosis Date   Depression    Scoliosis    Trichomoniasis    Past Surgical History:  Procedure Laterality Date   BUNIONECTOMY     TUBAL LIGATION     Patient Active Problem List   Diagnosis Date Noted   Need for hepatitis C screening test 03/15/2021   Obesity (BMI 30-39.9) 03/15/2021   Insomnia 03/15/2021   Intractable nausea and vomiting 06/26/2020   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   Vaginal discharge 10/06/2019   Hyperthyroidism 10/06/2019   Chronic low back pain with right-sided sciatica 10/06/2019   [place] finger, sequela 06/17/2016    PCP: Lyndle Herrlich, MD  REFERRING PROVIDER: Gust Rung, DO  REFERRING DIAG: [phone] (ICD-10-CM) - Chronic midline low back pain with right-sided sciatica  Rationale for Evaluation and Treatment Rehabilitation  THERAPY DIAG:  Other low back pain  Muscle weakness (generalized)  ONSET DATE: 1 month ago  SUBJECTIVE:                                                                                                                                                                                           SUBJECTIVE STATEMENT: Pt reports primary c/o acute mid-to-Rt sided LBP with associated cramping pain in her Rt thigh, not going past the knee. This pain is insidious in nature and began roughly a month ago.  Aggravating  factors include lumbar rotation, laying on side, sitting > 20 minutes. Easing factors include laying on back, standing and walking, massage. Pt reports she is woken from her sleep about twice per night. Pt denies any unexplained weight change, changes to bowel/ bladder function, saddle anesthesia, nausea/ vomiting. Current pain is 5.5/10. Worst pain is 8/10. Best pain is 5/10.  PERTINENT HISTORY:  Depression, scoliosis  PAIN:  Are you having pain? Yes: NPRS scale: 5.5/10 Pain location: Low back Pain description: Achy, crampy, burning Aggravating factors: lumbar rotation, laying on  side, sitting > 20 minutes Relieving factors: laying on back, standing and walking, massage   PRECAUTIONS: None  WEIGHT BEARING RESTRICTIONS No  FALLS:  Has patient fallen in last 6 months? No  LIVING ENVIRONMENT: Lives with: lives with their family Lives in: House/apartment Stairs: Yes: External: 3 steps; none Has following equipment at home: None  OCCUPATION: Personal care services  PLOF: Independent  PATIENT GOALS: Household ADLs, exercise  Screening for Suicide  Answer the following questions with Yes or No and place an "x" beside the action taken.  1. Over the past two weeks, have you felt down, depressed, or hopeless?  No   2. Within the past two weeks, have you felt little interest or pleasure in life?  No  If YES to either #1 or #2, then ask #3  3. Have you had thoughts that that life is not worth living or that you might be       better off dead?     If answer is NO and suspicion is low, then end   4. Over this past week, have you had any thoughts about hurting or even killing yourself?    If NO, then end. Patient in no immediate danger   5. If so, do you believe that you intend to or will harm yourself?       If NO, then end. Patient in no immediate danger   6.  Do you have a plan as to how you would hurt yourself?     7.  Over this past week, have you actually done  anything to hurt yourself?    IF YES answers to either #4, #5, #6 or #7, then patient is AT RISK for suicide   Actions Taken  __X__  Screening negative; no further action required  ____  Screening positive; no immediate danger and patient already in treatment with a  mental health provider. Advise patient to speak to their mental health provider.  ____  Screening positive; no immediate danger. Patient advised to contact a mental  health provider for further assessment.   ____  Screening positive; in immediate danger as patient states intention of killing self,  has plan and a sense of imminence. Do not leave alone. Seek permission from  patient to contact a family member to inform them. Direct patient to go to ED.   OBJECTIVE:   DIAGNOSTIC FINDINGS:  N/A  PATIENT SURVEYS:  Modified Oswestry 11/50, 22%   SCREENING FOR RED FLAGS: Bowel or bladder incontinence: No Cauda equina syndrome: No  COGNITION:  Overall cognitive status: Within functional limits for tasks assessed     SENSATION: Not tested  MUSCLE LENGTH: Hamstrings: WNL BIL Thomas test: moderate tightness BIL  POSTURE: increased lumbar lordosis  PALPATION: TTP with palpable trigger points in BIL Rt>Lt lumbar paraspinals  LUMBAR ROM:   Active  A/PROM  eval  Flexion WNL  Extension WNL  Right lateral flexion 75%, p!  Left lateral flexion 75%, p!  Right rotation WNL  Left rotation WNL   (Blank rows = not tested)   LOWER EXTREMITY MMT:    MMT Right eval Left eval  Hip flexion 4/5 4/5  Hip extension 3+/5 3+/5  Hip abduction 4/5 4/5  Knee flexion 5/5 5/5  Knee extension 5/5 5/5  Ankle dorsiflexion 5/5 5/5  Ankle plantarflexion 5/5 5/5   (Blank rows = not tested)  LUMBAR SPECIAL TESTS:  Slump: (-) SLR: (-) PLE: (+)  FUNCTIONAL TESTS:  5xSTS: 10 seconds Plank:  10 seconds Squat: WNL   TODAY'S TREATMENT    [facility] Adult PT Treatment:                                                DATE:  03/06/2022 Therapeutic Exercise: Bridge x10 Knee plank x30 seconds Side knee plank x30 seconds BIL Thomas stretch x30sec BIL Manual Therapy: Sidelying lumbar roll grade V manipulation x1 BIL with cavitation Neuromuscular re-ed: N/A Therapeutic Activity: N/A Modalities: N/A Self Care: N/A   PATIENT EDUCATION:  Education details: Pt educated on probable underlying pathophysiology, POC, prognosis, ODI, and HEP Person educated: Patient Education method: Explanation, Demonstration, and Handouts Education comprehension: verbalized understanding and returned demonstration   HOME EXERCISE PROGRAM: Access Code: [email or web address] URL: [email or web address] Date: 03/06/2022 Prepared by: Carmelina Dane  Exercises - Plank on Knees  - 1 x daily - 7 x weekly - 3 sets - 30 seconds hold - Side Plank on Knees  - 1 x daily - 7 x weekly - 3 sets - 30 seconds hold - Supine Bridge  - 1 x daily - 7 x weekly - 3 sets - 10 reps - 3-sec hold - Modified Thomas Stretch  - 1 x daily - 7 x weekly - 2-min hold  ASSESSMENT:  CLINICAL IMPRESSION: Patient is a 41 y.o. F who was seen today for physical therapy evaluation and treatment for acute LBP.  Upon assessment, pt's primary impairments include painful and limited BIL lumbar side bend AROM, weak BIL hip MMT, weak functional core strength, TTP to BIL Rt>Lt lumbar paraspinals, hypermobile and painful thoracolumbar passive accessory mobility, and tight BIL hip flexors. Ruling up mechanical LBP related to core weakness due to increased lumbar lordosis, poor plank testing, and hypermobile thoracolumbar passive accessories. The pt responded excellently to lumbar manipulation today, reporting improved pain levels and visually improved BIL lumbar side bend AROM following treatment. She will benefit from skilled PT to address her primary impairments and return to her prior level of function with less limitation.   OBJECTIVE IMPAIRMENTS Abnormal gait,  decreased endurance, decreased mobility, decreased ROM, decreased strength, hypomobility, impaired flexibility, improper body mechanics, postural dysfunction, and pain.   ACTIVITY LIMITATIONS carrying, lifting, bending, sitting, squatting, and sleeping  PARTICIPATION LIMITATIONS: cleaning, laundry, driving, shopping, community activity, occupation, and yard work  PERSONAL FACTORS 1 comorbidity: Depression  are also affecting patient's functional outcome.   REHAB POTENTIAL: Excellent  CLINICAL DECISION MAKING: Stable/uncomplicated  EVALUATION COMPLEXITY: Low   GOALS: Goals reviewed with patient? Yes  SHORT TERM GOALS: Target date: 04/03/2022  Pt will report understanding and adherence to initial HEP in order to promote independence in the management of primary impairments. Baseline: HEP provided at eval Goal status: INITIAL   LONG TERM GOALS: Target date: 05/01/2022  Pt will achieve an ODI score of 10% or less in order to demonstrate improved functional ability as it relates to her primary impairments. Baseline: 22% Goal status: INITIAL  2.  Pt will achieve a plank of 40 seconds or more in order to demonstrate improved functional core strength in the prophylaxis of future mechanical LBP. Baseline: 10 second Goal status: INITIAL  3.  Pt will achieve BIL global hip strength of 5/5 in order to progress her independent LE strengthening regimen with less limitation. Baseline: See MMT chart Goal status: INITIAL  4.  Pt will report ability to sit > 30  minutes with 0-3/10 pain in order to go on car trips with less limitation. Baseline: >6/10 pain with 20 minutes of sitting Goal status: INITIAL    PLAN: PT FREQUENCY: 1x/week  PT DURATION: 8 weeks  PLANNED INTERVENTIONS: Therapeutic exercises, Therapeutic activity, Neuromuscular re-education, Balance training, Gait training, Patient/Family education, Self Care, Joint mobilization, Joint manipulation, Orthotic/Fit training,  Aquatic Therapy, Dry Needling, Electrical stimulation, Spinal manipulation, Spinal mobilization, Cryotherapy, Moist heat, Taping, Traction, Biofeedback, Ionotophoresis 4mg /ml Dexamethasone, Manual therapy, and Re-evaluation.  PLAN FOR NEXT SESSION: Progress core/ hip strength, lumbar mobility, manual techniques PRN  [employer] Authorization   Choose one: Rehabilitative  Standardized Assessment or Functional Outcome Tool: See Pain Assessment and Oswestry  Score or Percent Disability: 22%  Body Parts Treated (Select each separately):  Lumbopelvic. Overall deficits/functional limitations for body part selected: moderate N/A. Overall deficits/functional limitations for body part selected: N/A N/A. Overall deficits/functional limitations for body part selected: N/A   If treatment provided at initial evaluation, no treatment charged due to lack of authorization.    , PT, DPT 03/06/22 2:46 PM

## 2022-03-06 ENCOUNTER — Ambulatory Visit: Payer: Medicaid Other | Attending: Internal Medicine

## 2022-03-06 ENCOUNTER — Other Ambulatory Visit: Payer: Self-pay

## 2022-03-06 DIAGNOSIS — M5441 Lumbago with sciatica, right side: Secondary | ICD-10-CM | POA: Insufficient documentation

## 2022-03-06 DIAGNOSIS — M5459 Other low back pain: Secondary | ICD-10-CM | POA: Diagnosis not present

## 2022-03-06 DIAGNOSIS — G8929 Other chronic pain: Secondary | ICD-10-CM | POA: Diagnosis not present

## 2022-03-06 DIAGNOSIS — M6281 Muscle weakness (generalized): Secondary | ICD-10-CM | POA: Diagnosis not present

## 2022-03-13 ENCOUNTER — Ambulatory Visit: Payer: Medicaid Other | Admitting: Physical Therapy

## 2022-03-20 ENCOUNTER — Ambulatory Visit: Payer: Medicaid Other

## 2022-03-20 ENCOUNTER — Telehealth: Payer: Self-pay

## 2022-03-20 NOTE — Telephone Encounter (Signed)
Spoke with pt regarding her first no show. Discussed clinic attendance policy and confirmed pt's next scheduled appointment. She reports understanding.

## 2022-03-27 ENCOUNTER — Telehealth: Payer: Self-pay

## 2022-03-27 ENCOUNTER — Ambulatory Visit: Payer: Medicaid Other

## 2022-03-27 NOTE — Telephone Encounter (Signed)
Spoke with pt regarding her 2nd no-show. Discussed the clinic's attendance policy and confirmed pt's next appointment. She reports understanding.

## 2022-04-03 ENCOUNTER — Ambulatory Visit: Payer: Medicaid Other

## 2022-04-03 DIAGNOSIS — M5459 Other low back pain: Secondary | ICD-10-CM | POA: Diagnosis not present

## 2022-04-03 DIAGNOSIS — M6281 Muscle weakness (generalized): Secondary | ICD-10-CM | POA: Diagnosis not present

## 2022-04-03 DIAGNOSIS — G8929 Other chronic pain: Secondary | ICD-10-CM | POA: Diagnosis not present

## 2022-04-03 DIAGNOSIS — M5441 Lumbago with sciatica, right side: Secondary | ICD-10-CM | POA: Diagnosis not present

## 2022-04-03 NOTE — Therapy (Addendum)
OUTPATIENT PHYSICAL THERAPY TREATMENT NOTE/ DISCHARGE SUMMARY   Patient Name: Tammy May MRN: [id number] DOB:[birth year], 41 y.o., female Today's Date: 05/09/2022  PCP: Linus Galas, MD REFERRING PROVIDER: Lucious Groves, DO  END OF SESSION:     Past Medical History:  Diagnosis Date   Depression    Scoliosis    Trichomoniasis    Past Surgical History:  Procedure Laterality Date   BUNIONECTOMY     TUBAL LIGATION     Patient Active Problem List   Diagnosis Date Noted   Migraine 05/02/2022   Need for hepatitis C screening test 03/15/2021   Obesity (BMI 30-39.9) 03/15/2021   Insomnia 03/15/2021   Intractable nausea and vomiting 06/26/2020   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 However, she reports continued tightness and pain in her low back. She reports non-adherence to her HEP due to business of life.   PAIN:  Are you having pain? Yes: NPRS scale: 5/10 Pain location: Low back Pain description: Achy, crampy, burning Aggravating factors: lumbar rotation, laying on side, sitting > 20 minutes Relieving factors: laying on back, standing and walking, massage   OBJECTIVE: (objective measures completed at initial evaluation unless otherwise dated)   DIAGNOSTIC FINDINGS:  N/A   PATIENT SURVEYS:  Modified Oswestry 11/50, 22%    SCREENING FOR RED FLAGS: Bowel or bladder incontinence: No Cauda equina syndrome: No    COGNITION:           Overall cognitive status: Within functional limits for tasks assessed                          SENSATION: Not tested   MUSCLE LENGTH: Hamstrings: WNL BIL Thomas test: moderate tightness BIL   POSTURE: increased lumbar lordosis   PALPATION: TTP with palpable trigger points in BIL Rt>Lt lumbar paraspinals   LUMBAR ROM:    Active  A/PROM  eval  Flexion WNL  Extension WNL  Right lateral flexion 75%, p!  Left lateral flexion 75%, p!  Right rotation WNL  Left rotation WNL   (Blank rows = not tested)     LOWER EXTREMITY MMT:     MMT Right eval Left eval  Hip flexion 4/5 4/5  Hip extension 3+/5 3+/5  Hip abduction 4/5 4/5  Knee flexion 5/5 5/5  Knee extension 5/5 5/5  Ankle dorsiflexion 5/5 5/5  Ankle plantarflexion 5/5 5/5   (Blank rows = not tested)   LUMBAR SPECIAL TESTS:  Slump: (-) SLR: (-) PLE: (+)   FUNCTIONAL TESTS:  5xSTS: 10 seconds Plank: 10 seconds Squat: WNL     TODAY'S TREATMENT   [facility] Adult PT Treatment:                                                DATE: 04/03/2022 Therapeutic Exercise: Supine bicycles with handhold resistance to thighs 3x20 Sidelying lumbar  open books x10  Side knee plank with hip abduction 2x10 BIL Dead lift with 15# kettlebell 3x10 Standing Pallof press with 7# cable 2x10 with 5-sec hold BIL Tall-kneeling on Airex pad with hip thrusts with 23# cable to waist attachment 2x12 Standing windmill stretch x10 BIL Manual Therapy: Sidelying lumbar roll grade V manipulation x1 BIL with cavitation Neuromuscular re-ed: N/A Therapeutic Activity: N/A Modalities: N/A Self Care: N/A                  [facility] Adult PT Treatment:                                                DATE: 03/06/2022 Therapeutic Exercise: Bridge x10 Knee plank x30 seconds Side knee plank x30 seconds BIL Thomas stretch x30sec BIL Manual Therapy: Sidelying lumbar roll grade V manipulation x1 BIL with cavitation Neuromuscular  re-ed: N/A Therapeutic Activity: N/A Modalities: N/A Self Care: N/A     PATIENT EDUCATION:  Education details: Pt educated on probable underlying pathophysiology, POC, prognosis, ODI, and HEP Person educated: Patient Education method: Explanation, Demonstration, and Handouts Education comprehension: verbalized understanding and returned demonstration     HOME EXERCISE PROGRAM: Access Code: [email or web address] URL: [email or web address] Date: 03/06/2022 Prepared by: Vanessa [place]   Exercises - Plank on Knees  - 1 x daily - 7 x weekly - 3 sets - 30 seconds hold - Side Plank on Knees  - 1 x daily - 7 x weekly - 3 sets - 30 seconds hold - Supine Bridge  - 1 x daily - 7 x weekly - 3 sets - 10 reps - 3-sec hold - Modified Thomas Stretch  - 1 x daily - 7 x weekly - 2-min hold   ASSESSMENT:   CLINICAL IMPRESSION: Pt responded well to all initial interventions today, demonstrating good form and no increase in pain with completed exercises. Pt reports a therapeutic response to activity today. Pt will continue to benefit from skilled PT to address her primary impairments and return to her prior level of function with less limitation.      OBJECTIVE IMPAIRMENTS Abnormal gait, decreased endurance, decreased mobility, decreased ROM, decreased strength, hypomobility, impaired flexibility, improper body mechanics, postural dysfunction, and pain.    ACTIVITY LIMITATIONS carrying, lifting, bending, sitting, squatting, and sleeping   PARTICIPATION LIMITATIONS: cleaning, laundry, driving, shopping, community activity, occupation, and yard work   PERSONAL FACTORS 1 comorbidity: Depression  are also affecting patient's functional outcome.        GOALS: Goals reviewed with patient? Yes   SHORT TERM GOALS: Target date: 04/03/2022   Pt will report understanding and adherence to initial HEP in order to promote independence in the management of primary impairments. Baseline: HEP  provided at eval Goal status: INITIAL     LONG TERM GOALS: Target date: 05/01/2022   Pt will achieve an ODI score of 10% or less in order to demonstrate improved functional ability as it relates to her primary impairments. Baseline: 22% Goal status: INITIAL   2.  Pt will achieve a plank of 40 seconds or more in order to demonstrate improved functional core strength in the prophylaxis of future mechanical LBP. Baseline: 10 second Goal status: INITIAL   3.  Pt will achieve BIL global hip strength of 5/5 in order to progress her independent LE strengthening regimen with less limitation. Baseline: See MMT chart  Goal status: INITIAL   4.  Pt will report ability to sit > 30 minutes with 0-3/10 pain in order to go on car trips with less limitation. Baseline: >6/10 pain with 20 minutes of sitting Goal status: INITIAL       PLAN: PT FREQUENCY: 1x/week   PT DURATION: 8 weeks   PLANNED INTERVENTIONS: Therapeutic exercises, Therapeutic activity, Neuromuscular re-education, Balance training, Gait training, Patient/Family education, Self Care, Joint mobilization, Joint manipulation, Orthotic/Fit training, Aquatic Therapy, Dry Needling, Electrical stimulation, Spinal manipulation, Spinal mobilization, Cryotherapy, Moist heat, Taping, Traction, Biofeedback, Ionotophoresis 59m/ml Dexamethasone, Manual therapy, and Re-evaluation.   PLAN FOR NEXT SESSION: Progress core/ hip strength, lumbar mobility, manual techniques PRN    YVanessa [place] PT, DPT 05/09/22 4:55 PM  PHYSICAL THERAPY DISCHARGE SUMMARY  Visits from Start of Care: 2  Current functional level related to goals / functional outcomes: Unable to assess   Remaining deficits: Unable to assess   Education / Equipment: HEP   Patient agrees to discharge. Patient goals were not met. Patient is being discharged due to not returning since the last visit.  YVanessa [place] PT, DPT 05/09/22 4:55 PM

## 2022-04-05 DIAGNOSIS — Z419 Encounter for procedure for purposes other than remedying health state, unspecified: Secondary | ICD-10-CM | POA: Diagnosis not present

## 2022-04-24 ENCOUNTER — Ambulatory Visit: Payer: Medicaid Other

## 2022-05-01 ENCOUNTER — Emergency Department (HOSPITAL_COMMUNITY): Payer: Medicaid Other

## 2022-05-01 ENCOUNTER — Other Ambulatory Visit: Payer: Self-pay

## 2022-05-01 ENCOUNTER — Encounter (HOSPITAL_COMMUNITY): Payer: Self-pay | Admitting: Emergency Medicine

## 2022-05-01 ENCOUNTER — Emergency Department (HOSPITAL_COMMUNITY)
Admission: EM | Admit: 2022-05-01 | Discharge: 2022-05-02 | Disposition: A | Discharge status: Home / Self Care | Payer: Medicaid Other | Attending: Emergency Medicine | Admitting: Emergency Medicine

## 2022-05-01 DIAGNOSIS — R55 Syncope and collapse: Secondary | ICD-10-CM | POA: Insufficient documentation

## 2022-05-01 DIAGNOSIS — Z20822 Contact with and (suspected) exposure to covid-19: Secondary | ICD-10-CM | POA: Insufficient documentation

## 2022-05-01 DIAGNOSIS — R519 Headache, unspecified: Secondary | ICD-10-CM | POA: Diagnosis not present

## 2022-05-01 DIAGNOSIS — Z5321 Procedure and treatment not carried out due to patient leaving prior to being seen by health care provider: Secondary | ICD-10-CM | POA: Diagnosis not present

## 2022-05-01 DIAGNOSIS — R42 Dizziness and giddiness: Secondary | ICD-10-CM | POA: Diagnosis not present

## 2022-05-01 LAB — CBC WITH DIFFERENTIAL/PLATELET
Abs Immature Granulocytes: 0.01 10*3/uL (ref 0.00–0.07)
Basophils Absolute: 0 10*3/uL (ref 0.0–0.1)
Basophils Relative: 1 %
Eosinophils Absolute: 0.1 10*3/uL (ref 0.0–0.5)
Eosinophils Relative: 1 %
HCT: 42.6 % (ref 36.0–46.0)
Hemoglobin: 13.7 g/dL (ref 12.0–15.0)
Immature Granulocytes: 0 %
Lymphocytes Relative: 43 %
Lymphs Abs: 2.4 10*3/uL (ref 0.7–4.0)
MCH: 29.3 pg (ref 26.0–34.0)
MCHC: 32.2 g/dL (ref 30.0–36.0)
MCV: 91.2 fL (ref 80.0–100.0)
Monocytes Absolute: 0.5 10*3/uL (ref 0.1–1.0)
Monocytes Relative: 10 %
Neutro Abs: 2.6 10*3/uL (ref 1.7–7.7)
Neutrophils Relative %: 45 %
Platelets: 216 10*3/uL (ref 150–400)
RBC: 4.67 MIL/uL (ref 3.87–5.11)
RDW: 12.9 % (ref 11.5–15.5)
WBC: 5.6 10*3/uL (ref 4.0–10.5)
nRBC: 0 % (ref 0.0–0.2)

## 2022-05-01 LAB — COMPREHENSIVE METABOLIC PANEL
ALT: 12 U/L (ref 0–44)
AST: 14 U/L — ABNORMAL LOW (ref 15–41)
Albumin: 3.6 g/dL (ref 3.5–5.0)
Alkaline Phosphatase: 54 U/L (ref 38–126)
Anion gap: 7 (ref 5–15)
BUN: 8 mg/dL (ref 6–20)
CO2: 24 mmol/L (ref 22–32)
Calcium: 9.2 mg/dL (ref 8.9–10.3)
Chloride: 110 mmol/L (ref 98–111)
Creatinine, Ser: 0.7 mg/dL (ref 0.44–1.00)
GFR, Estimated: >60 mL/min (ref >60)
Glucose, Bld: 85 mg/dL (ref 70–99)
Potassium: 4.3 mmol/L (ref 3.5–5.1)
Sodium: 141 mmol/L (ref 135–145)
Total Bilirubin: 0.5 mg/dL (ref 0.3–1.2)
Total Protein: 6.4 g/dL — ABNORMAL LOW (ref 6.5–8.1)

## 2022-05-01 LAB — RESP PANEL BY RT-PCR (FLU A&B, COVID) ARPGX2
Influenza A by PCR: NEGATIVE
Influenza B by PCR: NEGATIVE
SARS Coronavirus 2 by RT PCR: NEGATIVE

## 2022-05-01 LAB — I-STAT BETA HCG BLOOD, ED (MC, WL, AP ONLY): I-stat hCG, quantitative: <5 m[IU]/mL (ref <5)

## 2022-05-01 NOTE — ED Provider Triage Note (Signed)
Emergency Medicine Provider Triage Evaluation Note  Tammy May , a 41 y.o. female  was evaluated in triage.  Pt complains of sudden onset HA last night - sever , new. Caused her to have to pull over and was sharp for about 30 sec. Since ten she has hsad persisten HA. Sent in by internal \\med  clinic. + light headee/ near syncope  Review of Systems  Positive: ha Negative: weakness  Physical Exam  BP 119/89   Pulse 69   Temp 99.1 F (37.3 C) (Oral)   Resp 14   SpO2 100%  Gen:   Awake, no distress   Resp:  Normal effort  MSK:   Moves extremities without difficulty  Other:  No obvious neuro deficits  Medical Decision Making  Medically screening exam initiated at 7:15 PM.  Appropriate orders placed.  Tammy May was informed that the remainder of the evaluation will be completed by another provider, this initial triage assessment does not replace that evaluation, and the importance of remaining in the ED until their evaluation is complete.  Work up initiated.   Margarita Mail, PA-C 05/01/22 1920
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
## 2022-05-02 ENCOUNTER — Other Ambulatory Visit: Payer: Self-pay

## 2022-05-02 ENCOUNTER — Ambulatory Visit (INDEPENDENT_AMBULATORY_CARE_PROVIDER_SITE_OTHER): Payer: Medicaid Other

## 2022-05-02 ENCOUNTER — Ambulatory Visit: Payer: Medicaid Other

## 2022-05-02 VITALS — BP 92/57 | HR 84 | Temp 98.4°F | Ht 62.0 in | Wt 165.0 lb

## 2022-05-02 DIAGNOSIS — E0590 Thyrotoxicosis, unspecified without thyrotoxic crisis or storm: Secondary | ICD-10-CM

## 2022-05-02 DIAGNOSIS — G43919 Migraine, unspecified, intractable, without status migrainosus: Secondary | ICD-10-CM | POA: Diagnosis not present

## 2022-05-02 DIAGNOSIS — G43909 Migraine, unspecified, not intractable, without status migrainosus: Secondary | ICD-10-CM | POA: Insufficient documentation

## 2022-05-02 DIAGNOSIS — E069 Thyroiditis, unspecified: Secondary | ICD-10-CM | POA: Diagnosis not present

## 2022-05-02 MED ORDER — ACETAMINOPHEN 500 MG PO TABS: 1000.0000 mg | ORAL_TABLET | Freq: Four times a day (QID) | ORAL | 0 refills | Status: AC | PRN

## 2022-05-02 MED ORDER — IBUPROFEN 200 MG PO TABS: 400.0000 mg | ORAL_TABLET | Freq: Three times a day (TID) | ORAL | 0 refills | Status: DC

## 2022-05-02 NOTE — Assessment & Plan Note (Addendum)
Concern for hyperthyroidism at prior visits dating back to 2017 after presenting with unintentional weight loss and heat intolerance. TSH of 0.327 and T4 of 1.16 in 2017. Thyrotropin receptor antibodies were negative. Patient was not prescribed any medication long term. TSH/free T4 normal in 2021. T3/T4/TSH were ordered in 03/2021 but were not completed. Patient presents with generalized fatigue. Denies palpitations, weight loss, diaphoresis, neck pain, difficulty swallowing, lower extremity swelling. Will order thyroid labs today given previous workup for thyroid dysfunction, continued fatigue, and diffusely enlarged thyroid on exam.   Plan: - Ordered TSH and Free T4

## 2022-05-02 NOTE — ED Notes (Signed)
X1 no response for vitals recheck 

## 2022-05-02 NOTE — Assessment & Plan Note (Signed)
Patient presents with 3 days of acute onset bilateral sharp headache associated with dizziness, nausea, and 10 seconds of blurry vision. Denies photophobia or visual aura. Denies history of migraines or similar symptoms. Blurry vision is resolved but continues to have headache and dizziness. Did not take any medications at home for her symptoms. Presented to the ED on 05/01/22 for her symptoms. CT head negative. Covid negative. CBC and CMP WNL. Symptoms consistent with migraine.   Plan: - Ordered ibuprofen 400 mg TID - Ordered tylenol 1000 mg q6 PRN - Will call patient tomorrow to reassess her symptoms - Will consider starting triptan if ibuprofen and tylenol do not resolve her symptoms
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
## 2022-05-02 NOTE — Patient Instructions (Signed)
Thank you for coming to see [place] in clinic Tammy May.   Plan: - We will order ibuprofen and tylenol for your headache (I will call you tomorrow to see if your symptoms have improved and if not we can start another medication) - We will draw your blood today to measure your thyroid function (I will call you with the results of this lab work)  It was very nice to meet you.

## 2022-05-02 NOTE — ED Notes (Signed)
X2 no response °

## 2022-05-03 NOTE — Progress Notes (Signed)
Internal Medicine Clinic Attending  I saw and evaluated the patient.  I personally confirmed the key portions of the history and exam documented by Dr. Mapp and I reviewed pertinent patient test results.  The assessment, diagnosis, and plan were formulated together and I agree with the documentation in the resident's note.  

## 2022-05-03 NOTE — Telephone Encounter (Signed)
Called patient and discussed that she needs to try tylenol and advil in combination for 24 hours and then we will reassess possibly starting a triptan.

## 2022-05-04 LAB — TSH: TSH: 0.728 u[IU]/mL (ref 0.450–4.500)

## 2022-05-04 LAB — T4, FREE: Free T4: 1.06 ng/dL (ref 0.82–1.77)

## 2022-05-05 DIAGNOSIS — Z419 Encounter for procedure for purposes other than remedying health state, unspecified: Secondary | ICD-10-CM | POA: Diagnosis not present

## 2022-05-06 NOTE — Progress Notes (Signed)
I called the patient and updated her on the normal results of her TSH and free T4.

## 2022-05-12 DIAGNOSIS — H5213 Myopia, bilateral: Secondary | ICD-10-CM | POA: Diagnosis not present

## 2022-06-05 DIAGNOSIS — Z419 Encounter for procedure for purposes other than remedying health state, unspecified: Secondary | ICD-10-CM | POA: Diagnosis not present

## 2022-07-05 DIAGNOSIS — Z419 Encounter for procedure for purposes other than remedying health state, unspecified: Secondary | ICD-10-CM | POA: Diagnosis not present

## 2022-07-31 ENCOUNTER — Other Ambulatory Visit: Payer: Self-pay

## 2022-07-31 ENCOUNTER — Emergency Department (HOSPITAL_BASED_OUTPATIENT_CLINIC_OR_DEPARTMENT_OTHER): Payer: Medicaid Other | Admitting: Radiology

## 2022-07-31 ENCOUNTER — Encounter (HOSPITAL_BASED_OUTPATIENT_CLINIC_OR_DEPARTMENT_OTHER): Payer: Self-pay | Admitting: Emergency Medicine

## 2022-07-31 ENCOUNTER — Emergency Department (HOSPITAL_BASED_OUTPATIENT_CLINIC_OR_DEPARTMENT_OTHER): Payer: Medicaid Other

## 2022-07-31 ENCOUNTER — Emergency Department (HOSPITAL_BASED_OUTPATIENT_CLINIC_OR_DEPARTMENT_OTHER)
Admission: EM | Admit: 2022-07-31 | Discharge: 2022-07-31 | Disposition: A | Discharge status: Home / Self Care | Payer: Medicaid Other | Attending: Emergency Medicine | Admitting: Emergency Medicine

## 2022-07-31 DIAGNOSIS — M25511 Pain in right shoulder: Secondary | ICD-10-CM | POA: Diagnosis not present

## 2022-07-31 DIAGNOSIS — R102 Pelvic and perineal pain: Secondary | ICD-10-CM | POA: Diagnosis not present

## 2022-07-31 DIAGNOSIS — M25512 Pain in left shoulder: Secondary | ICD-10-CM | POA: Insufficient documentation

## 2022-07-31 DIAGNOSIS — S39012A Strain of muscle, fascia and tendon of lower back, initial encounter: Secondary | ICD-10-CM | POA: Diagnosis not present

## 2022-07-31 DIAGNOSIS — S0990XA Unspecified injury of head, initial encounter: Secondary | ICD-10-CM | POA: Insufficient documentation

## 2022-07-31 DIAGNOSIS — M545 Low back pain, unspecified: Secondary | ICD-10-CM | POA: Diagnosis not present

## 2022-07-31 MED ORDER — IBUPROFEN 800 MG PO TABS: 800.0000 mg | ORAL_TABLET | Freq: Three times a day (TID) | ORAL | 0 refills | Status: AC
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
## 2022-08-05 DIAGNOSIS — Z419 Encounter for procedure for purposes other than remedying health state, unspecified: Secondary | ICD-10-CM | POA: Diagnosis not present

## 2022-09-05 DIAGNOSIS — Z419 Encounter for procedure for purposes other than remedying health state, unspecified: Secondary | ICD-10-CM | POA: Diagnosis not present

## 2022-09-17 ENCOUNTER — Ambulatory Visit: Payer: Medicaid Other | Admitting: Family Medicine

## 2022-10-04 DIAGNOSIS — Z419 Encounter for procedure for purposes other than remedying health state, unspecified: Secondary | ICD-10-CM | POA: Diagnosis not present

## 2022-11-04 DIAGNOSIS — Z419 Encounter for procedure for purposes other than remedying health state, unspecified: Secondary | ICD-10-CM | POA: Diagnosis not present

## 2022-11-22 ENCOUNTER — Telehealth: Payer: Self-pay | Admitting: Student

## 2022-11-22 NOTE — Telephone Encounter (Signed)
Called Tammy May via phone call after receiving page. Verified identity via DOB and home address. She states that she has been getting boils in her groin area and under her armpits over the past year. She currently has a boil under her left armpit that appeared a few days ago. The boil is long and slightly raised. It is slightly darker than the surrounding skin but no redness noted. She states that it has intermittently been draining white colored fluid which subsequently turns slightly bloody. However, the boil is still there. She has been using warm compresses at home which have been helping. She denies any fevers, chills, or other systemic symptoms.   We discussed in-person visit for further evaluation of boil and to ensure there is no abscess present. Reassuring that she is not having any systemic symptoms. Can consider continued conservative measures and possible lancing of boil to expedite drainage (likely will not need this given spontaneous drainage). Will message [facility] front desk staff to schedule in-person visit for this next week.

## 2022-11-28 ENCOUNTER — Other Ambulatory Visit: Payer: Self-pay

## 2022-11-28 ENCOUNTER — Ambulatory Visit (INDEPENDENT_AMBULATORY_CARE_PROVIDER_SITE_OTHER): Payer: Medicaid Other

## 2022-11-28 VITALS — BP 95/68 | HR 86 | Temp 99.1°F | Ht 66.0 in | Wt 169.3 lb

## 2022-11-28 DIAGNOSIS — F39 Unspecified mood [affective] disorder: Secondary | ICD-10-CM

## 2022-11-28 DIAGNOSIS — L732 Hidradenitis suppurativa: Secondary | ICD-10-CM | POA: Diagnosis not present

## 2022-11-28 DIAGNOSIS — F308 Other manic episodes: Secondary | ICD-10-CM

## 2022-11-28 MED ORDER — DOXYCYCLINE HYCLATE 50 MG PO CAPS: 50.0000 mg | ORAL_CAPSULE | Freq: Two times a day (BID) | ORAL | 0 refills | Status: AC

## 2022-11-28 NOTE — Patient Instructions (Addendum)
[name], it was a pleasure seeing you today! You endorsed feeling well today. Below are some of the things we talked about this visit. We look forward to seeing you in the follow up appointment!  Today we discussed: Boil under armpit: use dial soap, warm compress, limit deodorants and other lotions under armpit. Take doxycycline  twice a day for 7 days. We will see what it looks like after that.  Psychiatry referral is in and therapy referral is in. Please do not take any more fluoxetine.  I have ordered the following labs today:  Lab Orders  No laboratory test(s) ordered today      Referrals ordered today:   Referral Orders         Ambulatory referral to Psychiatry         Ambulatory referral to Integrated Behavioral Health       I have ordered the following medication/changed the following medications:   Stop the following medications: Medications Discontinued During This Encounter  Medication Reason   FLUoxetine (PROZAC) 20 MG capsule Discontinued by provider     Start the following medications: Meds ordered this encounter  Medications   doxycycline (VIBRAMYCIN) 50 MG capsule    Sig: Take 1 capsule (50 mg total) by mouth 2 (two) times daily.    Dispense:  14 capsule    Refill:  0     Follow-up:  7-10 days for armpit chair    Please make sure to arrive 15 minutes prior to your next appointment. If you arrive late, you may be asked to reschedule.   We look forward to seeing you next time. Please call our clinic at [phone] if you have any questions or concerns. The best time to call is Monday-Friday from 9am-4pm, but there is someone available 24/7. If after hours or the weekend, call the main hospital number and ask for the Internal Medicine Resident On-Call. If you need medication refills, please notify your pharmacy one week in advance and they will send [place] a request.  Thank you for letting us take part in your care. Wishing you the best!  Thank  you, Lyndle Herrlich MD

## 2022-11-30 DIAGNOSIS — L732 Hidradenitis suppurativa: Secondary | ICD-10-CM | POA: Insufficient documentation

## 2022-11-30 DIAGNOSIS — F39 Unspecified mood [affective] disorder: Secondary | ICD-10-CM | POA: Insufficient documentation

## 2022-11-30 DIAGNOSIS — F32A Depression, unspecified: Secondary | ICD-10-CM | POA: Insufficient documentation

## 2022-11-30 NOTE — Progress Notes (Addendum)
   CC: Boil on her arm.  HPI:  Tammy May is a 42 y.o.-year-old female with past medical history as below presenting for active lesion under her left armpit.  Please see encounters tab for problem-based charting.  Past Medical History:  Diagnosis Date   Depression    Scoliosis    Trichomoniasis    Review of Systems: As in HPI.  Please see encounters tab for problem based charting.  Physical Exam:  Vitals:   11/28/22 1346  BP: 95/68  Pulse: 86  Temp: 99.1 F (37.3 C)  TempSrc: Oral  Weight: 169 lb 4.8 oz (76.8 kg)  Height: 5\' 6"  (1.676 m)   General:Well-appearing, pleasant, In NAD Cardiac: RRR, no murmurs rubs or gallops. Respiratory: Normal work of breathing on room air, CTAB Abdominal: Soft, nontender, nondistended Skin: small fluctuant mass underneath the left axilla without any purulent drainage at this time. Tender to palpation.  Psych: Appropriate mood and affect.  Thoughts congruent.  No pressured speech.  No audiovisual hallucinations.  No SI or HI  Assessment & Plan:   Hidradenitis suppurativa Presents for boil under left armpit.  She says that she has had many of these in the past.  Typically over the course of a couple weeks starts having induration and tenderness around a "boil" and then it expresses drainage and believes behind scars.  She has previously had these in both axilla as well as her inguinal folds, but currently only has 1 in the left axilla.  She is currently smoking, does not want assistance with cessation at this time.  Exam reveals a small fluctuant mass underneath the left axilla without any purulent drainage at this time.  Tender to palpation.  Consistent with hidradenitis.  Smoking and prior obesity are risk factors.  Discussed smoking cessation with her but she does not want assistance with quitting at this time, though she has made some progress towards this.  I see 1 active lesion in the left axilla and prior scarring from previous  lesions, but no significant tunneling or other active drainage at this time, I think we can manage conservatively for now. - Doxycycline 50 mg twice daily for 7 days for active lesion.  Do not think she needs to be on suppressive therapy at this time.  She had a tubal ligation.  No chance she could be pregnant. - Avoid shaving the area, new deodorants or other topicals.  Can use warm compresses, and antimicrobial soap - Consider initiating suppressive therapy and dermatology referral if recurrent or worsening in the future  Mood disorder (HCC) Presents with unspecified mood disorder, stating that fluoxetine is not working.  She says that previously she has been diagnosed with depression and anxiety.  However, she says that family members and coworkers have asked her to mention that she has occasional episodes of fast speech, high energy, reduced sleep.  She says that previously she has been hospitalized for psychiatric condition which might have been bipolar disorder.  She has never gone more than 24 to 32 hours without sleeping.  She was placed on Seroquel at one point but did not tolerate the medicine.  She is not currently manic, having any perceptual disturbances, and denies SI/HI. - DC fluoxetine, if her diagnosis is bipolar disorder this can precipitate mania - Psychiatry referral for appropriate diagnosis and possible initiation of mood stabilizers. - Will refer to be on care as well for counseling   Patient discussed with Dr.  Sol Blazing

## 2022-11-30 NOTE — Assessment & Plan Note (Signed)
Presents with unspecified mood disorder, stating that fluoxetine is not working.  She says that previously she has been diagnosed with depression and anxiety.  However, she says that family members and coworkers have asked her to mention that she has occasional episodes of fast speech, high energy, reduced sleep.  She says that previously she has been hospitalized for psychiatric condition which might have been bipolar disorder.  She has never gone more than 24 to 32 hours without sleeping.  She was placed on Seroquel at one point but did not tolerate the medicine.  She is not currently manic, having any perceptual disturbances, and denies SI/HI. - DC fluoxetine, if her diagnosis is bipolar disorder this can precipitate mania - Psychiatry referral for appropriate diagnosis and possible initiation of mood stabilizers. - Will refer to be on care as well for counseling

## 2022-11-30 NOTE — Assessment & Plan Note (Addendum)
Presents for boil under left armpit.  She says that she has had many of these in the past.  Typically over the course of a couple weeks starts having induration and tenderness around a "boil" and then it expresses drainage and believes behind scars.  She has previously had these in both axilla as well as her inguinal folds, but currently only has 1 in the left axilla.  She is currently smoking, does not want assistance with cessation at this time.  Exam reveals a small fluctuant mass underneath the left axilla without any purulent drainage at this time.  Tender to palpation.  Consistent with hidradenitis.  Smoking and prior obesity are risk factors.  Discussed smoking cessation with her but she does not want assistance with quitting at this time, though she has made some progress towards this.  I see 1 active lesion in the left axilla and prior scarring from previous lesions, but no significant tunneling or other active drainage at this time, I think we can manage conservatively for now. - Doxycycline 50 mg twice daily for 7 days for active lesion.  Do not think she needs to be on suppressive therapy at this time.  She had a tubal ligation.  No chance she could be pregnant. - Avoid shaving the area, new deodorants or other topicals.  Can use warm compresses, and antimicrobial soap - Consider initiating suppressive therapy and dermatology referral if recurrent or worsening in the future

## 2022-12-02 NOTE — Progress Notes (Signed)
Internal Medicine Clinic Attending  Case discussed with Dr. Sridharan  At the time of the visit.  We reviewed the resident's history and exam and pertinent patient test results.  I agree with the assessment, diagnosis, and plan of care documented in the resident's note.  

## 2022-12-04 DIAGNOSIS — Z419 Encounter for procedure for purposes other than remedying health state, unspecified: Secondary | ICD-10-CM | POA: Diagnosis not present

## 2022-12-06 NOTE — Telephone Encounter (Signed)
error 

## 2023-01-04 DIAGNOSIS — Z419 Encounter for procedure for purposes other than remedying health state, unspecified: Secondary | ICD-10-CM | POA: Diagnosis not present

## 2023-01-07 ENCOUNTER — Encounter: Payer: Self-pay | Admitting: *Deleted

## 2023-02-03 DIAGNOSIS — Z419 Encounter for procedure for purposes other than remedying health state, unspecified: Secondary | ICD-10-CM | POA: Diagnosis not present

## 2023-02-26 ENCOUNTER — Ambulatory Visit (HOSPITAL_COMMUNITY): Payer: Medicaid Other | Admitting: Student

## 2023-03-06 DIAGNOSIS — Z419 Encounter for procedure for purposes other than remedying health state, unspecified: Secondary | ICD-10-CM | POA: Diagnosis not present

## 2023-04-06 DIAGNOSIS — Z419 Encounter for procedure for purposes other than remedying health state, unspecified: Secondary | ICD-10-CM | POA: Diagnosis not present

## 2023-04-14 ENCOUNTER — Other Ambulatory Visit: Payer: Self-pay

## 2023-04-14 ENCOUNTER — Encounter: Payer: Self-pay | Admitting: Student

## 2023-04-14 ENCOUNTER — Ambulatory Visit (INDEPENDENT_AMBULATORY_CARE_PROVIDER_SITE_OTHER): Payer: Medicaid Other | Admitting: Student

## 2023-04-14 VITALS — BP 106/54 | HR 79 | Temp 98.3°F | Ht 66.0 in | Wt 166.2 lb

## 2023-04-14 DIAGNOSIS — G8929 Other chronic pain: Secondary | ICD-10-CM

## 2023-04-14 DIAGNOSIS — M5441 Lumbago with sciatica, right side: Secondary | ICD-10-CM

## 2023-04-14 MED ORDER — METHOCARBAMOL 500 MG PO TABS: 500.0000 mg | ORAL_TABLET | Freq: Two times a day (BID) | ORAL | 0 refills | Status: AC

## 2023-04-14 NOTE — Patient Instructions (Signed)
Thank you so much for coming to the clinic today!   I am going to send in some muscle relaxers for you which I hope will help with your pain. Remember, these can make you sleepy, so try not to take one if you need to drive somewhere. I am also sending in a referral back to physcial therapy, I think the exercises and stretches will make those muscles around your back much stronger.   If you have any questions please feel free to the call the clinic at anytime at [phone]. It was a pleasure seeing you!  Best, Dr. Thomasene Ripple

## 2023-04-15 NOTE — Progress Notes (Signed)
Internal Medicine Clinic Attending  Case discussed with the resident at the time of the visit.  We reviewed the resident's history and exam and pertinent patient test results.  I agree with the assessment, diagnosis, and plan of care documented in the resident's note.  

## 2023-04-15 NOTE — Assessment & Plan Note (Signed)
Patient with known history of chronic back pain presents complaining of the same pain.  It is located in the L4 region, and radiates down her right leg posterior.  On exam, it is tender straight leg test is positive on the right side.  She denies any red flag symptoms such as fevers, chills, change in bowel habits.  No dysuria, low concern for pyelonephritis.  Overall, patient's symptoms and appearance seem to be consistent with musculoskeletal.  She had a x-ray imaging of her lumbar spine back in December 2023 (this was well after symptom onset), which did not show any disc protrusion.  She was going to physical therapy, and is interested in returning to it.  Will place referral, as well as short course of muscle relaxers.  Plan: - Physical therapy referral - Robaxin 2-week course

## 2023-04-15 NOTE — Progress Notes (Signed)
CC: Back pain  HPI:  [name] is a 42 y.o. female living with a history stated below and presents today for neck pain. Please see problem based assessment and plan for additional details.  Past Medical History:  Diagnosis Date   Depression    Scoliosis    Trichomoniasis     Current Outpatient Medications on File Prior to Visit  Medication Sig Dispense Refill   acetaminophen (TYLENOL) 500 MG tablet Take 2 tablets (1,000 mg total) by mouth every 6 (six) hours as needed. 100 tablet 0   doxycycline (VIBRAMYCIN) 50 MG capsule Take 1 capsule (50 mg total) by mouth 2 (two) times daily. 14 capsule 0   ibuprofen (ADVIL) 800 MG tablet Take 1 tablet (800 mg total) by mouth 3 (three) times daily. 21 tablet 0   traZODone (DESYREL) 50 MG tablet Take 1 tablet (50 mg total) by mouth at bedtime. 30 tablet 2   No current facility-administered medications on file prior to visit.    Family History  Problem Relation Age of Onset   Cancer Father    Stroke Maternal Grandfather    Breast cancer Neg Hx     Social History   Socioeconomic History   Marital status: Single    Spouse name: Not on file   Number of children: 6   Years of education: Not on file   Highest education level: High school graduate  Occupational History   Not on file  Tobacco Use   Smoking status: Every Day    Current packs/day: 0.50    Types: Cigarettes   Smokeless tobacco: Never   Tobacco comments:    Patient stated that she is trying to cut back on her smoking  Vaping Use   Vaping status: Some Days  Substance and Sexual Activity   Alcohol use: Yes    Comment: occasionally   Drug use: Yes    Types: Marijuana    Comment: Daily, Cutting Back   Sexual activity: Yes    Birth control/protection: Surgical  Other Topics Concern   Not on file  Social History Narrative   Not on file   Social Determinants of Health   Financial Resource Strain: Not on file  Food Insecurity: No Food Insecurity  (07/19/2021)   Hunger Vital Sign    Worried About Running Out of Food in the Last Year: Never true    Ran Out of Food in the Last Year: Never true  Transportation Needs: No Transportation Needs (07/19/2021)   PRAPARE - Administrator, Civil Service (Medical): No    Lack of Transportation (Non-Medical): No  Physical Activity: Not on file  Stress: Not on file  Social Connections: Unknown (12/07/2021)   Received [facility]   Social Network    Social Network: Not on file  Intimate Partner Violence: Unknown (11/08/2021)   Received from [facility]   HITS    Physically Hurt: Not on file    Insult or Talk Down To: Not on file    Threaten Physical Harm: Not on file    Scream or Curse: Not on file    Review of Systems: ROS negative except for what is noted on the assessment and plan.  Vitals:   04/14/23 0957  BP: (!) 106/54  Pulse: 79  Temp: 98.3 F (36.8 C)  TempSrc: Oral  SpO2: 97%  Weight: 166 lb 3.2 oz (75.4 kg)  Height: 5\' 6"  (1.676 m)    Physical Exam: Constitutional: well-appearing female  in no acute distress Cardiovascular: regular rate and rhythm, no m/r/g Pulmonary/Chest: normal work of breathing on room air, lungs clear to auscultation bilaterally MSK: normal bulk and tone, tender in the L5 area, straight leg test positive. Neurological: alert & oriented x 3, 5/5 strength in bilateral upper and lower extremities, normal gait   Assessment & Plan:   Chronic low back pain with right-sided sciatica Patient with known history of chronic back pain presents complaining of the same pain.  It is located in the L4 region, and radiates down her right leg posterior.  On exam, it is tender straight leg test is positive on the right side.  She denies any red flag symptoms such as fevers, chills, change in bowel habits.  No dysuria, low concern for pyelonephritis.  Overall, patient's symptoms and appearance seem to be consistent with musculoskeletal.  She had a  x-ray imaging of her lumbar spine back in December 2023 (this was well after symptom onset), which did not show any disc protrusion.  She was going to physical therapy, and is interested in returning to it.  Will place referral, as well as short course of muscle relaxers.  Plan: - Physical therapy referral - Robaxin 2-week course  Patient discussed with Dr. Rockey Situ, M.D. [facility] Internal Medicine, PGY-2 Pager: [phone] Date 04/15/2023 Time 1:30 PM

## 2023-05-06 DIAGNOSIS — Z419 Encounter for procedure for purposes other than remedying health state, unspecified: Secondary | ICD-10-CM | POA: Diagnosis not present

## 2023-05-18 ENCOUNTER — Emergency Department (HOSPITAL_BASED_OUTPATIENT_CLINIC_OR_DEPARTMENT_OTHER)
Admission: EM | Admit: 2023-05-18 | Discharge: 2023-05-18 | Discharge status: Against Medical Advice | Payer: Medicaid Other | Attending: Emergency Medicine | Admitting: Emergency Medicine

## 2023-05-18 ENCOUNTER — Emergency Department (HOSPITAL_BASED_OUTPATIENT_CLINIC_OR_DEPARTMENT_OTHER): Payer: Medicaid Other

## 2023-05-18 ENCOUNTER — Other Ambulatory Visit: Payer: Self-pay

## 2023-05-18 ENCOUNTER — Encounter (HOSPITAL_BASED_OUTPATIENT_CLINIC_OR_DEPARTMENT_OTHER): Payer: Self-pay | Admitting: Emergency Medicine

## 2023-05-18 DIAGNOSIS — M79642 Pain in left hand: Secondary | ICD-10-CM | POA: Insufficient documentation

## 2023-05-18 DIAGNOSIS — Z5321 Procedure and treatment not carried out due to patient leaving prior to being seen by health care provider: Secondary | ICD-10-CM | POA: Insufficient documentation

## 2023-05-18 MED ORDER — ACETAMINOPHEN 325 MG PO TABS
ORAL_TABLET | ORAL | Status: AC
  Filled 2023-05-18: qty 2

## 2023-05-18 MED ORDER — ACETAMINOPHEN 325 MG PO TABS
650.0000 mg | ORAL_TABLET | Freq: Once | ORAL | Status: AC
  Administered 2023-05-18: 650 mg via ORAL

## 2023-05-18 NOTE — ED Triage Notes (Signed)
  Patient comes in with L hand pain that started yesterday morning.  Patient states it hurts to grip and put pressure on it.  No known injury.  States she may have slept on it wrong.  Pain 8/10, throbbing.  No OTC meds.

## 2023-06-06 DIAGNOSIS — Z419 Encounter for procedure for purposes other than remedying health state, unspecified: Secondary | ICD-10-CM | POA: Diagnosis not present

## 2023-06-19 ENCOUNTER — Ambulatory Visit: Payer: Medicaid Other | Admitting: Student

## 2023-06-19 ENCOUNTER — Telehealth: Payer: Self-pay | Admitting: *Deleted

## 2023-06-19 ENCOUNTER — Other Ambulatory Visit: Payer: Self-pay

## 2023-06-19 ENCOUNTER — Encounter: Payer: Self-pay | Admitting: Student

## 2023-06-19 VITALS — BP 106/69 | HR 97 | Temp 98.3°F | Ht 63.0 in | Wt 161.5 lb

## 2023-06-19 DIAGNOSIS — J069 Acute upper respiratory infection, unspecified: Secondary | ICD-10-CM | POA: Diagnosis not present

## 2023-06-19 MED ORDER — ALBUTEROL SULFATE HFA 108 (90 BASE) MCG/ACT IN AERS: 2.0000 | INHALATION_SPRAY | Freq: Four times a day (QID) | RESPIRATORY_TRACT | 2 refills | Status: AC | PRN

## 2023-06-19 MED ORDER — GUAIFENESIN-CODEINE 100-10 MG/5ML PO SOLN: 10.0000 mL | Freq: Three times a day (TID) | ORAL | 0 refills | Status: AC | PRN

## 2023-06-19 NOTE — Progress Notes (Signed)
CC: Shortness of breath  HPI: [name] is a 42 y.o. female living with a history stated below and presents today for shortness of breath. Please see problem based assessment and plan for additional details.  Past Medical History:  Diagnosis Date   Depression    Scoliosis    Trichomoniasis     Current Outpatient Medications on File Prior to Visit  Medication Sig Dispense Refill   doxycycline (VIBRAMYCIN) 50 MG capsule Take 1 capsule (50 mg total) by mouth 2 (two) times daily. 14 capsule 0   ibuprofen (ADVIL) 800 MG tablet Take 1 tablet (800 mg total) by mouth 3 (three) times daily. 21 tablet 0   methocarbamol (ROBAXIN) 500 MG tablet Take 1 tablet (500 mg total) by mouth in the morning and at bedtime. 28 tablet 0   traZODone (DESYREL) 50 MG tablet Take 1 tablet (50 mg total) by mouth at bedtime. 30 tablet 2   No current facility-administered medications on file prior to visit.    Family History  Problem Relation Age of Onset   Cancer Father    Stroke Maternal Grandfather    Breast cancer Neg Hx     Social History   Socioeconomic History   Marital status: Single    Spouse name: Not on file   Number of children: 6   Years of education: Not on file   Highest education level: High school graduate  Occupational History   Not on file  Tobacco Use   Smoking status: Every Day    Current packs/day: 0.50    Types: Cigarettes   Smokeless tobacco: Never   Tobacco comments:    Patient stated that she is trying to cut back on her smoking  Vaping Use   Vaping status: Some Days  Substance and Sexual Activity   Alcohol use: Yes    Comment: occasionally   Drug use: Yes    Types: Marijuana    Comment: Daily, Cutting Back   Sexual activity: Yes    Birth control/protection: Surgical  Other Topics Concern   Not on file  Social History Narrative   Not on file   Social Determinants of Health   Financial Resource Strain: Not on file  Food Insecurity: No Food  Insecurity (07/19/2021)   Hunger Vital Sign    Worried About Running Out of Food in the Last Year: Never true    Ran Out of Food in the Last Year: Never true  Transportation Needs: No Transportation Needs (07/19/2021)   PRAPARE - Administrator, Civil Service (Medical): No    Lack of Transportation (Non-Medical): No  Physical Activity: Not on file  Stress: Not on file  Social Connections: Unknown (12/07/2021)   Received from [facility]   Social Network    Social Network: Not on file  Intimate Partner Violence: Unknown (11/08/2021)   Received from [facility], [facility]   HITS    Physically Hurt: Not on file    Insult or Talk Down To: Not on file    Threaten Physical Harm: Not on file    Scream or Curse: Not on file    Review of Systems: ROS negative except for what is noted on the assessment and plan.  Vitals:   06/19/23 1344 06/19/23 1346  BP: 97/80 106/69  Pulse: 96 97  Temp: 98.3 F (36.8 C)   TempSrc: Oral   SpO2: 100%   Weight: 161 lb 8 oz (73.3 kg)   Height:  5\' 3"  (1.6 m)     Physical Exam: Constitutional: well-appearing in no acute distress HENT: normocephalic atraumatic, erythema and congestion in nares Eyes: conjunctiva non-erythematous Neck: supple Cardiovascular: regular rate and rhythm, no m/r/g Pulmonary/Chest: Bilateral expiratory wheezing Abdominal: soft, non-tender, non-distended MSK: normal bulk and tone Neurological: alert & oriented x 3, 5/5 strength in bilateral upper and lower extremities, normal gait Skin: warm and dry  Assessment & Plan:   Upper respiratory tract infection Patient endorses 2 to 3-day history of cough and shortness of breath.  She feels like the change of weather brought on these new signs and symptoms.  The cough worsened last night.  She has tried cough syrup which has not helped.  She is also having nasal congestion but denies sinus tenderness.  She denies any fever, night sweats, muscle  aches, fatigue.  She is satting at 100% on room air.  On examination, there is bilateral expiratory wheezing.  Low concern for COVID and signs and symptoms likely in the context of acute viral upper respiratory tract infection.  She has had albuterol as needed in the past but has not used this in a couple years.  Will resume this medication to help with her breathing.  Patient was counseled to follow-up if her signs and symptoms worsen or she develops a fever. - Begin albuterol as needed - Begin Robitussin for cough   Patient seen with Dr. Collier Flowers, MD  [facility] Internal Medicine, PGY-1 Date 06/19/2023 Time 3:02 PM

## 2023-06-19 NOTE — Assessment & Plan Note (Signed)
Patient endorses 2 to 3-day history of cough and shortness of breath.  She feels like the change of weather brought on these new signs and symptoms.  The cough worsened last night.  She has tried cough syrup which has not helped.  She is also having nasal congestion but denies sinus tenderness.  She denies any fever, night sweats, muscle aches, fatigue.  She is satting at 100% on room air.  On examination, there is bilateral expiratory wheezing.  Low concern for COVID and signs and symptoms likely in the context of acute viral upper respiratory tract infection.  She has had albuterol as needed in the past but has not used this in a couple years.  Will resume this medication to help with her breathing.  Patient was counseled to follow-up if her signs and symptoms worsen or she develops a fever. - Begin albuterol as needed - Begin Robitussin for cough

## 2023-06-19 NOTE — Telephone Encounter (Signed)
Call from patient requesting refill on Inhaler.  Inhaler has not been ordered for 2 years.Patient was advised to go to the Urgent Care or to come in this afternoon for 2:15 pm. Appointment in the Clinics.

## 2023-06-19 NOTE — Patient Instructions (Signed)
Thank you so much for coming to the clinic today!   I have put in an order for the albuterol and robitussin. Please call us if you worsen or start to develop a fever.   If you have any questions please feel free to the call the clinic at anytime at [phone]. It was a pleasure seeing you!  Best, Dr. Rayvon Char

## 2023-06-30 NOTE — Progress Notes (Signed)
Internal Medicine Clinic Attending  I was physically present during the key portions of the resident provided service and participated in the medical decision making of patient's management care. I reviewed pertinent patient test results.  The assessment, diagnosis, and plan were formulated together and I agree with the documentation in the resident's note.  Gust Rung, DO

## 2023-07-06 DIAGNOSIS — Z419 Encounter for procedure for purposes other than remedying health state, unspecified: Secondary | ICD-10-CM | POA: Diagnosis not present

## 2023-08-06 DIAGNOSIS — Z419 Encounter for procedure for purposes other than remedying health state, unspecified: Secondary | ICD-10-CM | POA: Diagnosis not present

## 2023-09-06 DIAGNOSIS — Z419 Encounter for procedure for purposes other than remedying health state, unspecified: Secondary | ICD-10-CM | POA: Diagnosis not present

## 2023-10-04 DIAGNOSIS — Z419 Encounter for procedure for purposes other than remedying health state, unspecified: Secondary | ICD-10-CM | POA: Diagnosis not present

## 2023-11-15 DIAGNOSIS — Z419 Encounter for procedure for purposes other than remedying health state, unspecified: Secondary | ICD-10-CM | POA: Diagnosis not present

## 2023-12-15 DIAGNOSIS — Z419 Encounter for procedure for purposes other than remedying health state, unspecified: Secondary | ICD-10-CM | POA: Diagnosis not present

## 2023-12-24 ENCOUNTER — Ambulatory Visit (INDEPENDENT_AMBULATORY_CARE_PROVIDER_SITE_OTHER): Admitting: Student

## 2023-12-24 ENCOUNTER — Ambulatory Visit: Payer: Self-pay

## 2023-12-24 VITALS — BP 103/71 | HR 93 | Temp 98.7°F | Ht 63.0 in | Wt 165.2 lb

## 2023-12-24 DIAGNOSIS — F32A Depression, unspecified: Secondary | ICD-10-CM

## 2023-12-24 DIAGNOSIS — F419 Anxiety disorder, unspecified: Secondary | ICD-10-CM | POA: Diagnosis not present

## 2023-12-24 MED ORDER — FLUOXETINE HCL 20 MG PO CAPS: 20.0000 mg | ORAL_CAPSULE | Freq: Every day | ORAL | 1 refills | Status: DC

## 2023-12-24 NOTE — Assessment & Plan Note (Signed)
 GAD-7 score of 21, anxiety exacerbating the setting of personal stressors. - Continue with psychotherapy and Prozac  as above.

## 2023-12-24 NOTE — Telephone Encounter (Signed)
 Pt has an appt today 5/21.

## 2023-12-24 NOTE — Assessment & Plan Note (Signed)
 She is presenting with a 3-week history of depressed mood, low energy level, decreased appetite, and sleep changes, and feeling of guilt.  She recently ended a 5-year relationship with her boyfriend, feeling very hopeless afterwards.  Intermittent worsening of the mood with smoking marijuana.  She denies active SI or HI.  PHQ-9 score of 20. On exam, she is teary and anxious.    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  Will treat this with psychotherapy and SSRI.  Thankfully, she has a therapist that she is comfortable with, she has an upcoming appointment scheduled for 5/25. She has a prior history of hyperthyroidism, her TSH was normal a year ago.  Will get a repeat TSH  Plan: -TSH -She has an upcoming schedule with her therapist on 5/24, encourage patient to try to keep that appointment. - Will restart Prozac  20 mg, patient advised it might take up to about 4 to 6 weeks to see noticeable difference.  Could consider uptitrating at next office visit. - Melatonin for sleep.  Not taking trazodone , discontinued as it can increase the risk for serotonin syndrome. - Follow-up in 6 -8 weeks.

## 2023-12-24 NOTE — Telephone Encounter (Signed)
 Copied from CRM [id number]. Topic: Clinical - Red Word Triage >> Dec 24, 2023 10:56 AM Carrielelia G wrote: [facility] that prompted transfer to Nurse Triage: anxiety, headaches, nausea, gagging every morning (started last week after an incident occurred)   Chief Complaint: Anxiety  Symptoms: Gagging, Vomiting, Loss of Appetite, Crying  Frequency: 2 Weeks Now Pertinent Negatives: Patient denies chest pain dyspnea  Disposition: [] ED /[] Urgent Care (no appt availability in office) / [x] Appointment(In office/virtual)/ []  [place] [facility]/ [] Home Care/ [] Refused Recommended Disposition /[] [facility]/ []  Follow-up with PCP Additional Notes: AH is being triaged for anxiety. The patient reports having episodes of anxiety that have been eliciting gagging, vomiting, loss of appetite. In office appointment made for today.   Please grant the patient with the number for the [facility] [phone]  Reason for Disposition  Patient sounds very upset or troubled to the triager  Answer Assessment - Initial Assessment Questions 1. CONCERN: "Did anything happen that prompted you to call today?"      A Recent Incident  2. ANXIETY SYMPTOMS: "Can you describe how you (your loved one; patient) have been feeling?" (e.g., tense, restless, panicky, anxious, keyed up, overwhelmed, sense of impending doom).      Anxious, Depressed, Fatigued  3. ONSET: "How long have you been feeling this way?" (e.g., hours, days, weeks)     2 Weeks  4. SEVERITY: "How would you rate the level of anxiety?" (e.g., 0 - 10; or mild, moderate, severe).     Moderate to Severe  5. FUNCTIONAL IMPAIRMENT: "How have these feelings affected your ability to do daily activities?" "Have you had more difficulty than usual doing your normal daily activities?" (e.g., getting better, same, worse; self-care, school, work, interactions)     Work  6. HISTORY: "Have you felt this way before?" "Have you ever been diagnosed with an anxiety  problem in the past?" (e.g., generalized anxiety disorder, panic attacks, PTSD). If Yes, ask: "How was this problem treated?" (e.g., medicines, counseling, etc.)     Yes, Anxiety  7. RISK OF HARM - SUICIDAL IDEATION: "Do you ever have thoughts of hurting or killing yourself?" If Yes, ask:  "Do you have these feelings now?" "Do you have a plan on how you would do this?"     *No Answer* 8. TREATMENT:  "What has been done so far to treat this anxiety?" (e.g., medicines, relaxation strategies). "What has helped?"     Yes, and have come off of them  9. TREATMENT - THERAPIST: "Do you have a counselor or therapist? Name?"     NO  10. POTENTIAL TRIGGERS: "Do you drink caffeinated beverages (e.g., coffee, colas, teas), and how much daily?" "Do you drink alcohol or use any drugs?" "Have you started any new medicines recently?"       No  11. PATIENT SUPPORT: "Who is with you now?" "Who do you live with?" "Do you have family or friends who you can talk to?"       Lives with kids, they help with coping  12. OTHER SYMPTOMS: "Do you have any other symptoms?" (e.g., feeling depressed, trouble concentrating, trouble sleeping, trouble breathing, palpitations or fast heartbeat, chest pain, sweating, nausea, or diarrhea)       Depressed, Difficulty concentrating  13. PREGNANCY: "Is there any chance you are pregnant?" "When was your last menstrual period?"       *No Answer*  Protocols used: Anxiety and Panic Attack-A-AH

## 2023-12-24 NOTE — Progress Notes (Signed)
   CC: Acute visit for depressed mood.  HPI:  Tammy May is a 43 y.o. female living with a history stated below and presents today for acute visit for anxiety attack.  Please see problem based assessment and plan for additional details.  Past Medical History:  Diagnosis Date   Depression    Scoliosis    Trichomoniasis     Current Outpatient Medications on File Prior to Visit  Medication Sig Dispense Refill   albuterol  (VENTOLIN  HFA) 108 (90 Base) MCG/ACT inhaler Inhale 2 puffs into the lungs every 6 (six) hours as needed for wheezing or shortness of breath. 8 g 2   doxycycline  (VIBRAMYCIN ) 50 MG capsule Take 1 capsule (50 mg total) by mouth 2 (two) times daily. 14 capsule 0   guaiFENesin -codeine  100-10 MG/5ML syrup Take 10 mLs by mouth 3 (three) times daily as needed for cough. 120 mL 0   ibuprofen  (ADVIL ) 800 MG tablet Take 1 tablet (800 mg total) by mouth 3 (three) times daily. 21 tablet 0   methocarbamol  (ROBAXIN ) 500 MG tablet Take 1 tablet (500 mg total) by mouth in the morning and at bedtime. 28 tablet 0   No current facility-administered medications on file prior to visit.    Review of Systems: ROS negative except for what is noted on the assessment and plan.  Vitals:   12/24/23 1424  BP: 103/71  Pulse: 93  Temp: 98.7 F (37.1 C)  TempSrc: Esophageal  SpO2: 97%  Weight: 165 lb 3.2 oz (74.9 kg)  Height: 5\' 3"  (1.6 m)    Physical Exam: Constitutional: Well appearing.  Cardiovascular: regular rate and rhythm, no m/r/g Pulmonary/Chest: normal work of breathing on room air, lungs clear to auscultation bilaterally Psych: Normal affect, speech is soft and slowed, normal thought process.  Intermittently teary.  Insight and judgment appear fair.  No hallucinations or delusions noted.   Assessment & Plan:   Patient discussed with Dr. Lelia Putnam  Depression She is presenting with a 3-week history of depressed mood, low energy level, decreased appetite, and sleep  changes, and feeling of guilt.  She recently ended a 5-year relationship with her boyfriend, feeling very hopeless afterwards.  Intermittent worsening of the mood with smoking marijuana.  She denies active SI or HI.  PHQ-9 score of 20. On exam, she is teary and anxious.    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  Will treat this with psychotherapy and SSRI.  Thankfully, she has a therapist that she is comfortable with, she has an upcoming appointment scheduled for 5/25. She has a prior history of hyperthyroidism, her TSH was normal a year ago.  Will get a repeat TSH  Plan: -TSH -She has an upcoming schedule with her therapist on 5/24, encourage patient to try to keep that appointment. - Will restart Prozac  20 mg, patient advised it might take up to about 4 to 6 weeks to see noticeable difference.  Could consider uptitrating at next office visit. - Melatonin for sleep.  Not taking trazodone , discontinued as it can increase the risk for serotonin syndrome. - Follow-up in 6 -8 weeks.   Anxiety GAD-7 score of 21, anxiety exacerbating the setting of personal stressors. - Continue with psychotherapy and Prozac  as above.   Marni Sins, MD [facility] Internal Medicine, PGY-1 Pager: [phone] Date 12/24/2023 Time 9:40 PM

## 2023-12-24 NOTE — Patient Instructions (Addendum)
 It was a pleasure taking care of you today!    Please take fluoxetine  20 mg 1 tablet once a day.  Please be advised it takes about 4 to 6 weeks to see the actual benefit.  2.  Please follow-up with your therapist as scheduled on 5/24  3.  I will call you with the results of your thyroid  labs.  I have ordered the following labs for you:  Lab Orders  No laboratory test(s) ordered today      Follow up: 6 months preferred pharmacy  Should you have any questions or concerns please call the internal medicine clinic at [phone].     Marni Sins, MD  [facility]

## 2023-12-25 NOTE — Progress Notes (Signed)
 Internal Medicine Clinic Attending  Case discussed with the resident at the time of the visit.  We reviewed the resident's history and exam and pertinent patient test results.  I agree with the assessment, diagnosis, and plan of care documented in the resident's note.

## 2023-12-26 ENCOUNTER — Other Ambulatory Visit

## 2024-01-15 ENCOUNTER — Ambulatory Visit (INDEPENDENT_AMBULATORY_CARE_PROVIDER_SITE_OTHER): Payer: Self-pay | Admitting: Internal Medicine

## 2024-01-15 ENCOUNTER — Encounter: Payer: Self-pay | Admitting: Internal Medicine

## 2024-01-15 VITALS — BP 120/73 | HR 102 | Temp 98.4°F | Ht 63.0 in | Wt 160.6 lb

## 2024-01-15 DIAGNOSIS — Z419 Encounter for procedure for purposes other than remedying health state, unspecified: Secondary | ICD-10-CM | POA: Diagnosis not present

## 2024-01-15 DIAGNOSIS — Z129 Encounter for screening for malignant neoplasm, site unspecified: Secondary | ICD-10-CM

## 2024-01-15 NOTE — Patient Instructions (Addendum)
 Ms Arkin,   We will make another appointment in 2-3 weeks to try the Pap smear again. Thank you for your patience.   Thanks,  Dr Esaw Heckler

## 2024-01-15 NOTE — Progress Notes (Signed)
    Subjective:  CC: Pap smear  HPI:  [name] is a 43 y.o. female with a past medical history stated below and presents today for above. Please see problem based assessment and plan for additional details.  Past Medical History:  Diagnosis Date   Depression    Scoliosis    Trichomoniasis     Current Outpatient Medications on File Prior to Visit  Medication Sig Dispense Refill   albuterol  (VENTOLIN  HFA) 108 (90 Base) MCG/ACT inhaler Inhale 2 puffs into the lungs every 6 (six) hours as needed for wheezing or shortness of breath. 8 g 2   doxycycline  (VIBRAMYCIN ) 50 MG capsule Take 1 capsule (50 mg total) by mouth 2 (two) times daily. 14 capsule 0   FLUoxetine  (PROZAC ) 20 MG capsule Take 1 capsule (20 mg total) by mouth daily. 30 capsule 1   guaiFENesin -codeine  100-10 MG/5ML syrup Take 10 mLs by mouth 3 (three) times daily as needed for cough. 120 mL 0   ibuprofen  (ADVIL ) 800 MG tablet Take 1 tablet (800 mg total) by mouth 3 (three) times daily. 21 tablet 0   methocarbamol  (ROBAXIN ) 500 MG tablet Take 1 tablet (500 mg total) by mouth in the morning and at bedtime. 28 tablet 0   No current facility-administered medications on file prior to visit.   Review of Systems: ROS negative except for as is noted on the assessment and plan.  Objective:   Vitals:   01/15/24 1549  BP: 120/73  Pulse: (!) 102  Temp: 98.4 F (36.9 C)  TempSrc: Oral  SpO2: 100%  Weight: 160 lb 9.6 oz (72.8 kg)  Height: 5' 3 (1.6 m)   Physical Exam: Constitutional: well-appearing, in no acute distress Cardiovascular: regular rate and rhythm Pulmonary/Chest: normal work of breathing on room air MSK: normal bulk and tone  Assessment & Plan:   Cancer screening Patient presents today for a Pap smear. On attempt to perform smear, patient's menstrual blood obscured the cervical os and testing could not be completed. Patient will be rescheduled for Pap smear at a later time.    Patient seen with  Dr. Frankie Israel MD [facility] Internal Medicine  PGY-1 Pager: [phone] Date 01/15/2024  Time 7:23 PM

## 2024-01-15 NOTE — Assessment & Plan Note (Addendum)
 Patient presents today for a Pap smear. On attempt to perform smear, patient's menstrual blood obscured the cervical os and testing could not be completed. Patient will be rescheduled for Pap smear at a later time.

## 2024-01-21 ENCOUNTER — Other Ambulatory Visit (HOSPITAL_COMMUNITY)
Admission: RE | Admit: 2024-01-21 | Discharge: 2024-01-21 | Disposition: A | Discharge status: Home / Self Care | Source: Ambulatory Visit | Attending: Internal Medicine | Admitting: Internal Medicine

## 2024-01-21 ENCOUNTER — Encounter: Payer: Self-pay | Admitting: Internal Medicine

## 2024-01-21 ENCOUNTER — Ambulatory Visit: Admitting: Internal Medicine

## 2024-01-21 VITALS — BP 112/77 | HR 99 | Temp 98.3°F | Ht 63.0 in | Wt 156.3 lb

## 2024-01-21 DIAGNOSIS — Z129 Encounter for screening for malignant neoplasm, site unspecified: Secondary | ICD-10-CM | POA: Diagnosis present

## 2024-01-21 DIAGNOSIS — F32A Depression, unspecified: Secondary | ICD-10-CM

## 2024-01-21 DIAGNOSIS — F419 Anxiety disorder, unspecified: Secondary | ICD-10-CM

## 2024-01-21 MED ORDER — FLUOXETINE HCL 40 MG PO CAPS: 40.0000 mg | ORAL_CAPSULE | Freq: Every day | ORAL | 1 refills | Status: DC

## 2024-01-21 NOTE — Patient Instructions (Addendum)
 Tammy May,   I will call you with the results of your Pap smear when they come back. I am also ordering an increased dose of your Prozac . You can keep taking 2 of your 20mg  pills daily until your current supply is out, then you can start taking 1 of the new 40mg  pills daily. We will plan a phone call visit in a month to see how this dosage is working for you.   Thanks,  Dr Esaw Heckler

## 2024-01-21 NOTE — Progress Notes (Signed)
    Subjective:  CC: Pap smear  HPI:  [name] is a 43 y.o. female with a past medical history stated below and presents today for above. Please see problem based assessment and plan for additional details.  Past Medical History:  Diagnosis Date   Depression    Scoliosis    Trichomoniasis     Current Outpatient Medications on File Prior to Visit  Medication Sig Dispense Refill   albuterol  (VENTOLIN  HFA) 108 (90 Base) MCG/ACT inhaler Inhale 2 puffs into the lungs every 6 (six) hours as needed for wheezing or shortness of breath. 8 g 2   doxycycline  (VIBRAMYCIN ) 50 MG capsule Take 1 capsule (50 mg total) by mouth 2 (two) times daily. 14 capsule 0   guaiFENesin -codeine  100-10 MG/5ML syrup Take 10 mLs by mouth 3 (three) times daily as needed for cough. 120 mL 0   ibuprofen  (ADVIL ) 800 MG tablet Take 1 tablet (800 mg total) by mouth 3 (three) times daily. 21 tablet 0   methocarbamol  (ROBAXIN ) 500 MG tablet Take 1 tablet (500 mg total) by mouth in the morning and at bedtime. 28 tablet 0   No current facility-administered medications on file prior to visit.   Review of Systems: ROS negative except for as is noted on the assessment and plan.  Objective:   Vitals:   01/21/24 0901  BP: 112/77  Pulse: 99  Temp: 98.3 F (36.8 C)  TempSrc: Oral  SpO2: 97%  Weight: 156 lb 4.8 oz (70.9 kg)  Height: 5' 3 (1.6 m)   Physical Exam: Constitutional: well-appearing, in no acute distress Cardiovascular: regular rate and rhythm Pulmonary/Chest: normal work of breathing on room air MSK: normal bulk and tone  Assessment & Plan:   Cancer screening Patient presents today for a Pap smear. She had an appointment last week but Pap could not be completed due to menstrual bleeding. Last Pap from 2023 was negative for intraepithelial lesion or malignancy (NILM). Denies recent vaginal bleeding, discharge, pain.  - Pap performed  Depression Patient feels since starting Prozac  recently  her mood has improved somewhat, but she is having a decreased appetite and loss of interest in cooking. She has lost about 10 pounds unintentionally due to decreased appetite. She reports mild anhedonia, and some insomnia. Otherwise modest improvement in her depression and anxiety. She is interested in going up on the dosage of her Prozac  and having another appointment in a month to determine the efficacy of the medication, or if a different medication would work better for her.  - Prozac  increased to 40mg  daily - Televisit in 4 weeks    Patient seen with Dr. Gaylon Kea MD [facility] Internal Medicine  PGY-1 Pager: [phone] Date 01/21/2024  Time 12:28 PM

## 2024-01-21 NOTE — Assessment & Plan Note (Signed)
 Patient presents today for a Pap smear. She had an appointment last week but Pap could not be completed due to menstrual bleeding. Last Pap from 2023 was negative for intraepithelial lesion or malignancy (NILM). Denies recent vaginal bleeding, discharge, pain.  - Pap performed

## 2024-01-21 NOTE — Assessment & Plan Note (Signed)
 Patient feels since starting Prozac  recently her mood has improved somewhat, but she is having a decreased appetite and loss of interest in cooking. She has lost about 10 pounds unintentionally due to decreased appetite. She reports mild anhedonia, and some insomnia. Otherwise modest improvement in her depression and anxiety. She is interested in going up on the dosage of her Prozac  and having another appointment in a month to determine the efficacy of the medication, or if a different medication would work better for her.  - Prozac  increased to 40mg  daily - Televisit in 4 weeks

## 2024-01-22 NOTE — Addendum Note (Signed)
 Addended by: Bevelyn Bryant on: 01/22/2024 12:01 PM   Modules accepted: Level of Service

## 2024-01-22 NOTE — Progress Notes (Signed)
Internal Medicine Clinic Attending  I was physically present during the key portions of the resident provided service and participated in the medical decision making of patient's management care. I reviewed pertinent patient test results.  The assessment, diagnosis, and plan were formulated together and I agree with the documentation in the resident's note.  Williams, Julie Anne, MD  

## 2024-01-22 NOTE — Progress Notes (Signed)
 Internal Medicine Clinic Attending  I was physically present during the key portions of the resident provided service and participated in the medical decision making of patient's management care. I reviewed pertinent patient test results.  The assessment, diagnosis, and plan were formulated together and I agree with the documentation in the resident's note. I was present during the performance of Pap smear and assisted in sample collection. Cervical tissue appeared healthy, mild bleeding with sample collection. Thin white discharge, patient denies any symptoms.   Bevelyn Bryant, MD

## 2024-01-27 LAB — CYTOLOGY - PAP

## 2024-01-30 LAB — CYTOLOGY - PAP
Comment: NEGATIVE
Comment: NEGATIVE
Comment: NEGATIVE
Diagnosis: UNDETERMINED — AB
HPV 18 / 45: NEGATIVE
High risk HPV: POSITIVE — AB

## 2024-02-04 ENCOUNTER — Other Ambulatory Visit: Payer: Self-pay | Admitting: Student

## 2024-02-04 DIAGNOSIS — B977 Papillomavirus as the cause of diseases classified elsewhere: Secondary | ICD-10-CM

## 2024-02-04 DIAGNOSIS — Z129 Encounter for screening for malignant neoplasm, site unspecified: Secondary | ICD-10-CM

## 2024-02-04 NOTE — Progress Notes (Signed)
 Tammy May is a 43 year old female with a medical history notable for HPV infection, depression, and anxiety, who initially presented to Dr. Francella for cancer screening. Her most recent Pap smear prior to that visit, performed in 2023, was negative for intraepithelial lesions or malignancy. 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 The persistence of high-risk HPV increases her risk for developing cervical intraepithelial neoplasia, despite current cytology findings. Given the persistent infection and ASC-US  results, I believe it is appropriate to refer her to [facility] for colposcopy. Attempts to contact Ms. Creed to discuss the plan have been unsuccessful. Nonetheless, I will proceed with initiating the referral process and continue efforts to reach her to review the next steps.  - Colposcopy referral

## 2024-02-14 DIAGNOSIS — Z419 Encounter for procedure for purposes other than remedying health state, unspecified: Secondary | ICD-10-CM | POA: Diagnosis not present

## 2024-03-16 DIAGNOSIS — Z419 Encounter for procedure for purposes other than remedying health state, unspecified: Secondary | ICD-10-CM | POA: Diagnosis not present

## 2024-04-16 DIAGNOSIS — Z419 Encounter for procedure for purposes other than remedying health state, unspecified: Secondary | ICD-10-CM | POA: Diagnosis not present

## 2024-04-22 ENCOUNTER — Ambulatory Visit: Payer: Self-pay

## 2024-04-22 NOTE — Telephone Encounter (Signed)
 FYI Only or Action Required?: Action required by provider: request for appointment.  Patient was last seen in primary care on 01/21/2024 by Francella Rogue, MD.  Called Nurse Triage reporting Back Pain.  Symptoms began several months ago.  Interventions attempted: Rest, hydration, or home remedies.  Symptoms are: gradually worsening.  Triage Disposition: See PCP When Office is Open (Within 3 Days)  Patient/caregiver understands and will follow disposition?: Yes  Copied from CRM [id number]. Topic: Clinical - Red Word Triage >> Apr 22, 2024  9:52 AM Zane F wrote: [facility] that prompted transfer to Nurse Triage:   Concern: worsening back pain with scoliosis   Symptoms:   Trouble sleeping (has to sleep on her back to go to sleep)  When did the symptoms start?: more than a year  What have you done to aid in the concern ? Have you taken anything to assist with the matter?: No Reason for Disposition  [1] MODERATE back pain (e.g., interferes with normal activities) AND [2] present > 3 days  Answer Assessment - Initial Assessment Questions 1. ONSET: When did the pain begin? (e.g., minutes, hours, days)     Long time 2. LOCATION: Where does it hurt? (upper, mid or lower back)     Low back 3. SEVERITY: How bad is the pain?  (e.g., Scale 1-10; mild, moderate, or severe)     severe 4. PATTERN: Is the pain constant? (e.g., yes, no; constant, intermittent)      Comes and goes 5. RADIATION: Does the pain shoot into your legs or somewhere else?     sometimes 6. CAUSE:  What do you think is causing the back pain?      scoliosis 7. BACK OVERUSE:  Any recent lifting of heavy objects, strenuous work or exercise?     no 8. MEDICINES: What have you taken so far for the pain? (e.g., nothing, acetaminophen , NSAIDS)     no 9. NEUROLOGIC SYMPTOMS: Do you have any weakness, numbness, or problems with bowel/bladder control?     no 10. OTHER SYMPTOMS: Do you have any other  symptoms? (e.g., fever, abdomen pain, burning with urination, blood in urine)       no 11. PREGNANCY: Is there any chance you are pregnant? When was your last menstrual period?       no  Protocols used: Back Pain-A-AH

## 2024-05-06 ENCOUNTER — Ambulatory Visit: Payer: Self-pay

## 2024-05-06 VITALS — BP 104/64 | HR 73 | Temp 98.0°F | Ht 63.0 in | Wt 164.6 lb

## 2024-05-06 DIAGNOSIS — G8929 Other chronic pain: Secondary | ICD-10-CM | POA: Diagnosis not present

## 2024-05-06 DIAGNOSIS — M5441 Lumbago with sciatica, right side: Secondary | ICD-10-CM | POA: Diagnosis not present

## 2024-05-06 MED ORDER — LIDOCAINE 5 % EX PTCH: 1.0000 | MEDICATED_PATCH | CUTANEOUS | 0 refills | Status: AC

## 2024-05-06 MED ORDER — DICLOFENAC SODIUM 1 % EX GEL: 2.0000 g | Freq: Four times a day (QID) | CUTANEOUS | 1 refills | Status: AC

## 2024-05-06 NOTE — Progress Notes (Signed)
 Acute Office Visit  Subjective:     Patient ID: Tammy May, female    DOB: 08-20-1980, 43 y.o.   MRN: [id number]  Chief Complaint  Patient presents with   Back Pain    HPI Patient is a 43 year old female with past medical history of chronic low back pain with right-sided sciatica, compression and anxiety who presents today with concerns of persistent lower back pain.Patient has documented history of chronic low back pain with right-sided sciatica.She states that the pain is pretty consistent.  States that the pain is worse at night and that it feels like there is fluid in her back.  Patient states that she has some numbness and tingling that goes down her right leg but no pain that goes down either leg.  She states that the pain is like a dull ache normally that becomes sharp at times especially when standing for a long time.  Patient denies any urinary or bowel incontinence.  She denies any injuries or accidents to the area.  No previous history of surgeries.  Patient denies fevers, chills, no weight loss.  Patient states that she has tried Tylenol  and ibuprofen  and it does not help her pain.  ROS Noted in HPI     Objective:    BP 104/64 (BP Location: Left Arm, Patient Position: Sitting, Cuff Size: Small)   Pulse 73   Temp 98 F (36.7 C) (Oral)   Ht 5' 3 (1.6 m)   Wt 164 lb 9.6 oz (74.7 kg)   SpO2 99%   BMI 29.16 kg/m  BP Readings from Last 3 Encounters:  05/06/24 104/64  01/21/24 112/77  01/15/24 120/73   Wt Readings from Last 3 Encounters:  05/06/24 164 lb 9.6 oz (74.7 kg)  01/21/24 156 lb 4.8 oz (70.9 kg)  01/15/24 160 lb 9.6 oz (72.8 kg)      Physical Exam Constitution: No acute distress, alert Heart: Regular rate and rhythm, no murmurs heard Lungs: Respiratory effort normal, clear to auscultation MSK: No midline tenderness to lower thoracic or lumbar spine.  No erythema or rashes on skin of back.  Tenderness to palpation in the right paraspinal area but  otherwise no tenderness to paraspinal area. Intact range of motion with flexion and extension, internal rotation, external rotation bilateral lower extremities.  Some pain with internal rotation on the right leg.  Intact strength in lower extremities bilaterally including with plantarflexion dorsiflexion bilaterally. No obvious lumbar spine deformity noted on exam Gait normal No results found for any visits on 05/06/24.      Assessment & Plan:   Problem List Items Addressed This Visit     Chronic low back pain with right-sided sciatica - Primary   In the absence of red flag symptoms, will not get any further imaging at this time.  Patient was concerned as there was mild levoscoliosis noted of the lumbar spine on imaging from December 2023.  But on exam today no obvious deformity of spine.  Mild degenerative changes in lumbar spine noted on imaging from that time but otherwise clear.  This is likely paraspinal muscle related.   Plan: Voltaren gel Lidocaine  patches to the lower back Encouraged patient to keep physically active, which she does for her job, and encouraged healthy diet to aid in weight loss that could also help back pain       Meds ordered this encounter  Medications   lidocaine  (LIDODERM ) 5 %    Sig: Place 1 patch onto the skin  daily for 10 days. Remove & Discard patch within 12 hours or as directed by MD    Dispense:  10 patch    Refill:  0   diclofenac Sodium (VOLTAREN ARTHRITIS PAIN) 1 % GEL    Sig: Apply 2 g topically 4 (four) times daily.    Dispense:  2 g    Refill:  1    Return if symptoms worsen or fail to improve.  Kwadwo Taras D'Mello, DO Patient seen with Dr. Jeanelle

## 2024-05-06 NOTE — Assessment & Plan Note (Signed)
 In the absence of red flag symptoms, will not get any further imaging at this time.  Patient was concerned as there was mild levoscoliosis noted of the lumbar spine on imaging from December 2023.  But on exam today no obvious deformity of spine.  Mild degenerative changes in lumbar spine noted on imaging from that time but otherwise clear.  This is likely paraspinal muscle related.   Plan: Voltaren gel Lidocaine  patches to the lower back Encouraged patient to keep physically active, which she does for her job, and encouraged healthy diet to aid in weight loss that could also help back pain

## 2024-05-06 NOTE — Patient Instructions (Signed)
 Today we discussed the following medical conditions and plan:   For your back pain, I have sent in some lidocaine  patches and Voltaren gel to your pharmacy that should help with the pain.  The biggest thing to help with lower back pain is good posture and also exercise that we can strengthen those muscles that support her spine.  We ruled out anything big bad and scary today.  I think also going to a chiropractor can help with this pain or even any massage therapy.  Please let us  know if you have any other concerns.  We look forward to seeing you next time. Please call our clinic at [phone] if you have any questions or concerns. The best time to call is Monday-Friday from 9am-4pm, but there is someone available 24/7. If you need medication refills, please notify your pharmacy one week in advance and they will send us  a request.   Thank you for trusting me with your care. Wishing you the best!   Davaughn Hillyard D'Mello, DO  [facility]

## 2024-05-10 NOTE — Progress Notes (Signed)
 Internal Medicine Clinic Attending  I was physically present during the key portions of the resident provided service and participated in the medical decision making of patient's management care. I reviewed pertinent patient test results.  The assessment, diagnosis, and plan were formulated together and I agree with the documentation in the resident's note.  Jeanelle Layman CROME, MD

## 2024-05-13 DIAGNOSIS — H5213 Myopia, bilateral: Secondary | ICD-10-CM | POA: Diagnosis not present

## 2024-05-16 DIAGNOSIS — Z419 Encounter for procedure for purposes other than remedying health state, unspecified: Secondary | ICD-10-CM | POA: Diagnosis not present

## 2024-05-26 ENCOUNTER — Other Ambulatory Visit: Payer: Self-pay | Admitting: Student

## 2024-05-26 DIAGNOSIS — F32A Depression, unspecified: Secondary | ICD-10-CM

## 2024-05-26 DIAGNOSIS — F419 Anxiety disorder, unspecified: Secondary | ICD-10-CM

## 2024-05-26 MED ORDER — FLUOXETINE HCL 40 MG PO CAPS: 40.0000 mg | ORAL_CAPSULE | Freq: Every day | ORAL | 1 refills | Status: AC

## 2024-05-26 NOTE — Telephone Encounter (Unsigned)
 Copied from CRM [id number]. Topic: Clinical - Medication Refill >> May 26, 2024  9:55 AM Chiquita SQUIBB wrote: Medication:  FLUoxetine  FLUoxetine  (PROZAC ) 40 MG capsule    Has the patient contacted their pharmacy? Yes- The pharmacy stated that the insurance does not cover the intern that wrote the original prescription.    (Agent: If no, request that the patient contact the pharmacy for the refill. If patient does not wish to contact the pharmacy document the reason why and proceed with request.) (Agent: If yes, when and what did the pharmacy advise?)  This is the patient's preferred pharmacy:  [facility] [phone] - [place], [place] - [address] AT [facility] [address] [place] [address] Phone: [phone] Fax: [phone]   Is this the correct pharmacy for this prescription? Yes If no, delete pharmacy and type the correct one.   Has the prescription been filled recently? No  Is the patient out of the medication? Yes  Has the patient been seen for an appointment in the last year OR does the patient have an upcoming appointment? Yes  Can we respond through MyChart? Yes  Agent: Please be advised that Rx refills may take up to 3 business days. We ask that you follow-up with your pharmacy.

## 2024-05-28 ENCOUNTER — Ambulatory Visit: Payer: Self-pay

## 2024-05-28 ENCOUNTER — Ambulatory Visit

## 2024-05-28 VITALS — BP 119/70 | HR 83 | Temp 98.2°F | Ht 63.0 in | Wt 158.2 lb

## 2024-05-28 DIAGNOSIS — R8789 Other abnormal findings in specimens from female genital organs: Secondary | ICD-10-CM

## 2024-05-28 DIAGNOSIS — M5441 Lumbago with sciatica, right side: Secondary | ICD-10-CM | POA: Diagnosis not present

## 2024-05-28 DIAGNOSIS — G8929 Other chronic pain: Secondary | ICD-10-CM | POA: Diagnosis not present

## 2024-05-28 DIAGNOSIS — F17210 Nicotine dependence, cigarettes, uncomplicated: Secondary | ICD-10-CM | POA: Diagnosis not present

## 2024-05-28 DIAGNOSIS — B977 Papillomavirus as the cause of diseases classified elsewhere: Secondary | ICD-10-CM | POA: Diagnosis not present

## 2024-05-28 DIAGNOSIS — L720 Epidermal cyst: Secondary | ICD-10-CM | POA: Diagnosis not present

## 2024-05-28 NOTE — Assessment & Plan Note (Signed)
 Patient has continued complaints of lower back pain.  Has not tried Voltaren gel because instructions on box state not to place on back.  Because of this patient also did not pick up lidocaine  patches.  Told patient that Voltaren gel might be less effective on back, however it is still safe to use.  Instructed her to try using lidocaine  patches as well.  On physical exam using diagnostic osteopathic manipulative medicine, it is clear that she has tense paraspinal musculature and would benefit from OMT.  Instructed her to make an appointment next week for follow-up and treatment. -Schedule appointment next week for OMT -Use Voltaren gel and lidocaine  patch as needed

## 2024-05-28 NOTE — Assessment & Plan Note (Signed)
 Counseled patient on HPV, HPV clinical presentations, and HPV risks of cervical cancer.  Patient stated understanding and said that she would follow-up with her gynecologist; has appointment scheduled.

## 2024-05-28 NOTE — Assessment & Plan Note (Signed)
 Epidermoid cyst present on right chest wall.  Patient states aggravation with stinky drainage.  Cyst will pop and refill. -Ambulatory referral to dermatology

## 2024-05-28 NOTE — Patient Instructions (Addendum)
 Today we discussed the following medical conditions and plan:   Lower back pain - we will do some OMT osteopathic manipulative medicine to help ease your back pain. In the meantime, you can use your lidocaine  patch or voltaren gel.   We look forward to seeing you next time. Please call our clinic at [phone] if you have any questions or concerns. The best time to call is Monday-Friday from 9am-4pm, but there is someone available 24/7. If you need medication refills, please notify your pharmacy one week in advance and they will send us  a request.   Thank you for trusting me with your care. Wishing you the best!   Sallyanne Primas, DO  [facility]

## 2024-05-28 NOTE — Progress Notes (Signed)
 CC: HPV diagnosis. Lower back pain. OMT scheduled for next week.   HPI:  Tammy May is a 43 y.o. female with pertinent past medical history of lower back pain (further medical history stated below) and presents today for discussion of HPV diagnosis and lower back pain. Please see problem based assessment and plan for additional details.  Last clinic appointment: Discussed low back pain, recommended Voltaren gel and lidocaine  patch.  Last Pertinent Labs Documented:     Latest Ref Rng & Units 05/01/2022    7:35 PM 03/15/2021   11:49 AM 06/26/2020    5:07 AM  BMP  Glucose 70 - 99 mg/dL 85  85  94   BUN 6 - 20 mg/dL 8  10  5    Creatinine 0.44 - 1.00 mg/dL 9.29  9.38  9.42   BUN/Creat Ratio 9 - 23  16    Sodium 135 - 145 mmol/L 141  142  140   Potassium 3.5 - 5.1 mmol/L 4.3  4.8  3.0   Chloride 98 - 111 mmol/L 110  107  108   CO2 22 - 32 mmol/L 24  21  18    Calcium 8.9 - 10.3 mg/dL 9.2  9.0  7.9        Latest Ref Rng & Units 05/01/2022    7:35 PM 06/26/2020    5:07 AM 06/25/2020    4:04 PM  CBC  WBC 4.0 - 10.5 K/uL 5.6  2.6  3.7   Hemoglobin 12.0 - 15.0 g/dL 86.2  87.3  84.1   Hematocrit 36.0 - 46.0 % 42.6  39.3  47.4   Platelets 150 - 400 K/uL 216  119  151     Lab Results  Component Value Date   HGBA1C 5.2 03/15/2021     Past Medical History:  Diagnosis Date   Depression    Scoliosis    Trichomoniasis     Current Outpatient Medications on File Prior to Visit  Medication Sig Dispense Refill   albuterol  (VENTOLIN  HFA) 108 (90 Base) MCG/ACT inhaler Inhale 2 puffs into the lungs every 6 (six) hours as needed for wheezing or shortness of breath. 8 g 2   diclofenac Sodium (VOLTAREN ARTHRITIS PAIN) 1 % GEL Apply 2 g topically 4 (four) times daily. 2 g 1   doxycycline  (VIBRAMYCIN ) 50 MG capsule Take 1 capsule (50 mg total) by mouth 2 (two) times daily. 14 capsule 0   FLUoxetine  (PROZAC ) 40 MG capsule Take 1 capsule (40 mg total) by mouth daily. 30 capsule 1    guaiFENesin -codeine  100-10 MG/5ML syrup Take 10 mLs by mouth 3 (three) times daily as needed for cough. 120 mL 0   ibuprofen  (ADVIL ) 800 MG tablet Take 1 tablet (800 mg total) by mouth 3 (three) times daily. 21 tablet 0   methocarbamol  (ROBAXIN ) 500 MG tablet Take 1 tablet (500 mg total) by mouth in the morning and at bedtime. 28 tablet 0   No current facility-administered medications on file prior to visit.    Family History  Problem Relation Age of Onset   Cancer Father    Stroke Maternal Grandfather    Breast cancer Neg Hx     Social History   Socioeconomic History   Marital status: Single    Spouse name: Not on file   Number of children: 6   Years of education: Not on file   Highest education level: High school graduate  Occupational History   Not on file  Tobacco  Use   Smoking status: Every Day    Current packs/day: 0.50    Types: Cigarettes   Smokeless tobacco: Never   Tobacco comments:    Patient stated that she is trying to cut back on her smoking  Vaping Use   Vaping status: Some Days  Substance and Sexual Activity   Alcohol use: Yes    Comment: occasionally   Drug use: Yes    Types: Marijuana    Comment: Daily, Cutting Back   Sexual activity: Yes    Birth control/protection: Surgical  Other Topics Concern   Not on file  Social History Narrative   Not on file   Social Drivers of Health   Financial Resource Strain: Low Risk  (05/06/2024)   Overall Financial Resource Strain (CARDIA)    Difficulty of Paying Living Expenses: Not very hard  Food Insecurity: No Food Insecurity (05/06/2024)   Hunger Vital Sign    Worried About Running Out of Food in the Last Year: Never true    Ran Out of Food in the Last Year: Never true  Transportation Needs: No Transportation Needs (05/06/2024)   PRAPARE - Administrator, Civil Service (Medical): No    Lack of Transportation (Non-Medical): No  Physical Activity: Sufficiently Active (05/06/2024)   Exercise Vital  Sign    Days of Exercise per Week: 5 days    Minutes of Exercise per Session: 60 min  Stress: No Stress Concern Present (05/06/2024)   [employer] of Occupational Health - Occupational Stress Questionnaire    Feeling of Stress: Only a little  Social Connections: Unknown (12/07/2021)   [facility]   Social Network    Social Network: Not on file  Intimate Partner Violence: Not At Risk (05/06/2024)   Humiliation, Afraid, Rape, and Kick questionnaire    Fear of Current or Ex-Partner: No    Emotionally Abused: No    Physically Abused: No    Sexually Abused: No    Review of Systems: As stated below  Vitals:   05/28/24 0854  BP: 119/70  Pulse: 83  Temp: 98.2 F (36.8 C)  TempSrc: Oral  SpO2: 100%  Weight: 158 lb 3.2 oz (71.8 kg)  Height: 5' 3 (1.6 m)    Physical Exam: Physical Exam Cardiovascular:     Rate and Rhythm: Normal rate and regular rhythm.     Heart sounds: Normal heart sounds. No murmur heard.    No friction rub. No gallop.  Pulmonary:     Effort: Pulmonary effort is normal.     Breath sounds: Normal breath sounds. No stridor. No wheezing, rhonchi or rales.  Abdominal:     General: Abdomen is flat. Bowel sounds are normal.     Palpations: Abdomen is soft.     Tenderness: There is no abdominal tenderness.  Musculoskeletal:       Back:     Right lower leg: No edema.     Left lower leg: No edema.     Comments: Patient has significant ropey, tense, fibrotic paraspinal muscle changes present on the lower thoracic and upper lumbar region.  L1-L4 rotated left.  T10-12 rotated right.  Mild scoliosis without kyphosis present.  Skin:    General: Skin is warm and dry.  Neurological:     Mental Status: She is alert.      Assessment & Plan:   Patient discussed with Dr. Trudy  Assessment & Plan Chronic bilateral low back pain with right-sided sciatica Patient has continued  complaints of lower back pain.  Has not tried Voltaren gel because  instructions on box state not to place on back.  Because of this patient also did not pick up lidocaine  patches.  Told patient that Voltaren gel might be less effective on back, however it is still safe to use.  Instructed her to try using lidocaine  patches as well.  On physical exam using diagnostic osteopathic manipulative medicine, it is clear that she has tense paraspinal musculature and would benefit from OMT.  Instructed her to make an appointment next week for follow-up and treatment. -Schedule appointment next week for OMT -Use Voltaren gel and lidocaine  patch as needed HPV (human papilloma virus) infection Counseled patient on HPV, HPV clinical presentations, and HPV risks of cervical cancer.  Patient stated understanding and said that she would follow-up with her gynecologist; has appointment scheduled. Epidermoid cyst of skin of chest Epidermoid cyst present on right chest wall.  Patient states aggravation with stinky drainage.  Cyst will pop and refill. -Ambulatory referral to dermatology   Orders Placed This Encounter  Procedures   Ambulatory referral to Dermatology    Referral Priority:   Routine    Referral Type:   Consultation    Referral Reason:   Specialty Services Required    Requested Specialty:   Dermatology    Number of Visits Requested:   1     Tareka Jhaveri, D.O. [facility], PGY-1 Date 05/28/2024 Time 5:42 PM

## 2024-06-02 ENCOUNTER — Ambulatory Visit

## 2024-06-02 VITALS — BP 111/76 | HR 72 | Temp 97.6°F | Ht 63.0 in | Wt 158.4 lb

## 2024-06-02 DIAGNOSIS — G8929 Other chronic pain: Secondary | ICD-10-CM | POA: Diagnosis not present

## 2024-06-02 DIAGNOSIS — R350 Frequency of micturition: Secondary | ICD-10-CM | POA: Insufficient documentation

## 2024-06-02 DIAGNOSIS — M5441 Lumbago with sciatica, right side: Secondary | ICD-10-CM | POA: Diagnosis not present

## 2024-06-02 MED ORDER — LIDOCAINE 5 % EX PTCH: 1.0000 | MEDICATED_PATCH | CUTANEOUS | 0 refills | Status: AC

## 2024-06-02 NOTE — Progress Notes (Unsigned)
 CC: Follow-up for low back pain/OMT  HPI:  [name] is a 43 y.o. female with pertinent past medical history of low back pain (further medical history stated below) and presents today for follow-up for low back pain/OMT. Please see problem based assessment and plan for additional details.  Last clinic appointment: 05/28/2024  Last Pertinent Labs Documented:     Latest Ref Rng & Units 05/01/2022    7:35 PM 03/15/2021   11:49 AM 06/26/2020    5:07 AM  BMP  Glucose 70 - 99 mg/dL 85  85  94   BUN 6 - 20 mg/dL 8  10  5    Creatinine 0.44 - 1.00 mg/dL 9.29  9.38  9.42   BUN/Creat Ratio 9 - 23  16    Sodium 135 - 145 mmol/L 141  142  140   Potassium 3.5 - 5.1 mmol/L 4.3  4.8  3.0   Chloride 98 - 111 mmol/L 110  107  108   CO2 22 - 32 mmol/L 24  21  18    Calcium 8.9 - 10.3 mg/dL 9.2  9.0  7.9        Latest Ref Rng & Units 05/01/2022    7:35 PM 06/26/2020    5:07 AM 06/25/2020    4:04 PM  CBC  WBC 4.0 - 10.5 K/uL 5.6  2.6  3.7   Hemoglobin 12.0 - 15.0 g/dL 86.2  87.3  84.1   Hematocrit 36.0 - 46.0 % 42.6  39.3  47.4   Platelets 150 - 400 K/uL 216  119  151     Lab Results  Component Value Date   HGBA1C 5.2 03/15/2021     Past Medical History:  Diagnosis Date   Depression    Scoliosis    Trichomoniasis     Current Outpatient Medications on File Prior to Visit  Medication Sig Dispense Refill   albuterol  (VENTOLIN  HFA) 108 (90 Base) MCG/ACT inhaler Inhale 2 puffs into the lungs every 6 (six) hours as needed for wheezing or shortness of breath. 8 g 2   diclofenac Sodium (VOLTAREN ARTHRITIS PAIN) 1 % GEL Apply 2 g topically 4 (four) times daily. 2 g 1   doxycycline  (VIBRAMYCIN ) 50 MG capsule Take 1 capsule (50 mg total) by mouth 2 (two) times daily. 14 capsule 0   FLUoxetine  (PROZAC ) 40 MG capsule Take 1 capsule (40 mg total) by mouth daily. 30 capsule 1   guaiFENesin -codeine  100-10 MG/5ML syrup Take 10 mLs by mouth 3 (three) times daily as needed for cough. 120 mL 0    ibuprofen  (ADVIL ) 800 MG tablet Take 1 tablet (800 mg total) by mouth 3 (three) times daily. 21 tablet 0   methocarbamol  (ROBAXIN ) 500 MG tablet Take 1 tablet (500 mg total) by mouth in the morning and at bedtime. 28 tablet 0   No current facility-administered medications on file prior to visit.    Family History  Problem Relation Age of Onset   Cancer Father    Stroke Maternal Grandfather    Breast cancer Neg Hx     Social History   Socioeconomic History   Marital status: Single    Spouse name: Not on file   Number of children: 6   Years of education: Not on file   Highest education level: High school graduate  Occupational History   Not on file  Tobacco Use   Smoking status: Every Day    Current packs/day: 0.50    Types: Cigarettes  Smokeless tobacco: Never   Tobacco comments:    Patient stated that she is trying to cut back on her smoking  Vaping Use   Vaping status: Some Days  Substance and Sexual Activity   Alcohol use: Yes    Comment: occasionally   Drug use: Yes    Types: Marijuana    Comment: Daily, Cutting Back   Sexual activity: Yes    Birth control/protection: Surgical  Other Topics Concern   Not on file  Social History Narrative   Not on file   Social Drivers of Health   Financial Resource Strain: Low Risk  (05/06/2024)   Overall Financial Resource Strain (CARDIA)    Difficulty of Paying Living Expenses: Not very hard  Food Insecurity: No Food Insecurity (05/06/2024)   Hunger Vital Sign    Worried About Running Out of Food in the Last Year: Never true    Ran Out of Food in the Last Year: Never true  Transportation Needs: No Transportation Needs (05/06/2024)   PRAPARE - Administrator, Civil Service (Medical): No    Lack of Transportation (Non-Medical): No  Physical Activity: Sufficiently Active (05/06/2024)   Exercise Vital Sign    Days of Exercise per Week: 5 days    Minutes of Exercise per Session: 60 min  Stress: No Stress  Concern Present (05/06/2024)   [employer] of Occupational Health - Occupational Stress Questionnaire    Feeling of Stress: Only a little  Social Connections: Unknown (12/07/2021)   Received from [facility]   Social Network    Social Network: Not on file  Intimate Partner Violence: Not At Risk (05/06/2024)   Humiliation, Afraid, Rape, and Kick questionnaire    Fear of Current or Ex-Partner: No    Emotionally Abused: No    Physically Abused: No    Sexually Abused: No    Review of Systems: As stated below  Vitals:   06/02/24 1040  BP: 111/76  Pulse: 72  Temp: 97.6 F (36.4 C)  TempSrc: Oral  SpO2: 95%  Weight: 158 lb 6.4 oz (71.8 kg)  Height: 5' 3 (1.6 m)    Physical Exam: Physical Exam Cardiovascular:     Rate and Rhythm: Normal rate and regular rhythm.     Heart sounds: No murmur heard.    No friction rub. No gallop.  Pulmonary:     Effort: Pulmonary effort is normal.     Breath sounds: Normal breath sounds. No stridor. No wheezing, rhonchi or rales.  Abdominal:     General: Bowel sounds are normal.     Palpations: Abdomen is soft.     Tenderness: There is no abdominal tenderness.  Musculoskeletal:        General: Tenderness present.     Right lower leg: No edema.     Left lower leg: No edema.     Comments: Patient has significant ropey, tense, fibrotic paraspinal muscle changes present on the lower thoracic and lumbar region. T10-L4 Extended, R L, SB L Mild scoliosis without kyphosis present.   Skin:    General: Skin is warm and dry.     Assessment & Plan:   Patient seen with Dr. Trudy Assessment & Plan Chronic bilateral low back pain with right-sided sciatica Encounter for OMT by osteopathic physician. Patient endorsed slight suprapubic pain prior to starting treatment that she said was manageable and wanted to go treat back. Patient denied urinary frequency, urgency, suprapubic pain, or dysuria. Patient has menstrual period coming up this  week.  Unlikely UTI, suspecting possible period cramps.   Diagnosis: T12-L4 Extended, Rotated left sidebent left OMT performed: Muscle energy and myofascial release  Patient was walked through all portions of treatment prior to initiation. Patient consent was obtained throughout treatment course. Treatment began with diagnosis followed by myofascial release with patient prone on exam table. Once myofascial release was felt, patient was instructed to get up slowly and walk carefully around the room. Patient endorsed improvement of symptoms. Next, patient sat on chair and direct and indirect muscle energy was performed targeting the lumbar spine. After this treatment, patient was again instructed to get up and walk around slowly. Patient endorsed improvement of symptoms again. With some time left, patient requested myofascial release again. Patient went back to prone position on exam table and myofascial release was again performed targeting the left lower T spine. Tender-point was appreciated and I was unable to get full release prior to ending session. Recheck of lumbar and thoracic spine demonstrated improvement compared to beginning of session with decreased tightness along paraspinal musculature.   Instructed patient to drink plenty of water and take Tylenol  for pain relief if needed over the next couple of days.  Conversation with patient included that this is not a cure, she may need multiple sessions, and this type of therapy may not work for her specific type of back pain.  Hopeful for some improvement in commendation with lidocaine  patch and Voltaren gel.   At end of session, patient endorsed >80% resolution of pain symptoms in lower back.  -instructed patient to return in 2 weeks for follow up and repeat session if needed.  -instructed to continue Voltaren gel or OTC lidocaine  patch between sessions.   No orders of the defined types were placed in this encounter.    Sallyanne Primas, D.O. [facility]  Internal Medicine, PGY-1 Date 06/03/2024 Time 8:41 PM

## 2024-06-02 NOTE — Assessment & Plan Note (Addendum)
 Encounter for OMT by osteopathic physician. Patient endorsed slight suprapubic pain prior to starting treatment that she said was manageable and wanted to go treat back. Patient denied urinary frequency, urgency, suprapubic pain, or dysuria. Patient has menstrual period coming up this week. Unlikely UTI, suspecting possible period cramps.   Diagnosis: T12-L4 Extended, Rotated left sidebent left OMT performed: Muscle energy and myofascial release  Patient was walked through all portions of treatment prior to initiation. Patient consent was obtained throughout treatment course. Treatment began with diagnosis followed by myofascial release with patient prone on exam table. Once myofascial release was felt, patient was instructed to get up slowly and walk carefully around the room. Patient endorsed improvement of symptoms. Next, patient sat on chair and direct and indirect muscle energy was performed targeting the lumbar spine. After this treatment, patient was again instructed to get up and walk around slowly. Patient endorsed improvement of symptoms again. With some time left, patient requested myofascial release again. Patient went back to prone position on exam table and myofascial release was again performed targeting the left lower T spine. Tender-point was appreciated and I was unable to get full release prior to ending session. Recheck of lumbar and thoracic spine demonstrated improvement compared to beginning of session with decreased tightness along paraspinal musculature.   Instructed patient to drink plenty of water and take Tylenol  for pain relief if needed over the next couple of days.  Conversation with patient included that this is not a cure, she may need multiple sessions, and this type of therapy may not work for her specific type of back pain.  Hopeful for some improvement in commendation with lidocaine  patch and Voltaren gel.   At end of session, patient endorsed >80% resolution of pain  symptoms in lower back.  -instructed patient to return in 2 weeks for follow up and repeat session if needed.  -instructed to continue Voltaren gel or OTC lidocaine  patch between sessions.

## 2024-06-02 NOTE — Patient Instructions (Addendum)
 Today we discussed the following medical conditions and plan:   Today we did Osteopathic manipulative medicine: muscle energy and myofacial release targeting your lower Thoracic spine and lumbar spine.   Drink water and take tylenol  if you experience any back pain from our treatment.  If you have breakthrough pain, use voltaren gel and lidocaine  patch.   We can follow up in 3 weeks.   We look forward to seeing you next time. Please call our clinic at [phone] if you have any questions or concerns. The best time to call is Monday-Friday from 9am-4pm, but there is someone available 24/7. If you need medication refills, please notify your pharmacy one week in advance and they will send us  a request.   Thank you for trusting me with your care. Wishing you the best!   Sallyanne Primas, DO  [facility]

## 2024-06-03 ENCOUNTER — Telehealth: Payer: Self-pay

## 2024-06-03 NOTE — Telephone Encounter (Signed)
 Received a fax from the pharmacy regarding a rx for Lidocaine  patch. Per pharmacy the drug is not covered by the patients plan. The preferred alternative is DERMACINRXLIDOCAN,LIDOCANV,LIDOCANV,ZTLIDO ,LIDOCANII.  [facility] [phone] - [place], [place] - [address]

## 2024-06-09 NOTE — Progress Notes (Signed)
Internal Medicine Clinic Attending  I was physically present during the key portions of the resident provided service and participated in the medical decision making of patient's management care. I reviewed pertinent patient test results.  The assessment, diagnosis, and plan were formulated together and I agree with the documentation in the resident's note.  Williams, Julie Anne, MD  

## 2024-06-09 NOTE — Progress Notes (Signed)
 Internal Medicine Clinic Attending  Case discussed with the resident at the time of the visit.  We reviewed the resident's history and exam and pertinent patient test results.  I agree with the assessment, diagnosis, and plan of care documented in the resident's note.

## 2024-06-16 DIAGNOSIS — Z419 Encounter for procedure for purposes other than remedying health state, unspecified: Secondary | ICD-10-CM | POA: Diagnosis not present

## 2024-07-16 DIAGNOSIS — Z419 Encounter for procedure for purposes other than remedying health state, unspecified: Secondary | ICD-10-CM | POA: Diagnosis not present
